# Patient Record
Sex: Male | Born: 1965 | Smoking: Current every day smoker
Health system: Southern US, Community
[De-identification: ages and names within clinical notes are randomized; demographics above are authoritative.]

---

## 2013-12-05 ENCOUNTER — Inpatient Hospital Stay (HOSPITAL_COMMUNITY)
Admission: EM | Admit: 2013-12-05 | Discharge: 2014-01-03 | DRG: 917 | Disposition: E | Payer: Non-veteran care | Attending: Pulmonary Disease | Admitting: Pulmonary Disease

## 2013-12-05 ENCOUNTER — Inpatient Hospital Stay (HOSPITAL_COMMUNITY): Payer: Non-veteran care

## 2013-12-05 ENCOUNTER — Encounter (HOSPITAL_COMMUNITY): Payer: Self-pay | Admitting: Emergency Medicine

## 2013-12-05 ENCOUNTER — Emergency Department (HOSPITAL_COMMUNITY): Payer: Non-veteran care

## 2013-12-05 DIAGNOSIS — E870 Hyperosmolality and hypernatremia: Secondary | ICD-10-CM | POA: Diagnosis present

## 2013-12-05 DIAGNOSIS — Z515 Encounter for palliative care: Secondary | ICD-10-CM

## 2013-12-05 DIAGNOSIS — R4020 Unspecified coma: Secondary | ICD-10-CM | POA: Diagnosis present

## 2013-12-05 DIAGNOSIS — R7309 Other abnormal glucose: Secondary | ICD-10-CM | POA: Diagnosis present

## 2013-12-05 DIAGNOSIS — E872 Acidosis, unspecified: Secondary | ICD-10-CM

## 2013-12-05 DIAGNOSIS — D696 Thrombocytopenia, unspecified: Secondary | ICD-10-CM | POA: Diagnosis present

## 2013-12-05 DIAGNOSIS — Z66 Do not resuscitate: Secondary | ICD-10-CM | POA: Diagnosis not present

## 2013-12-05 DIAGNOSIS — G935 Compression of brain: Secondary | ICD-10-CM | POA: Diagnosis present

## 2013-12-05 DIAGNOSIS — E874 Mixed disorder of acid-base balance: Secondary | ICD-10-CM | POA: Diagnosis present

## 2013-12-05 DIAGNOSIS — I498 Other specified cardiac arrhythmias: Secondary | ICD-10-CM | POA: Diagnosis present

## 2013-12-05 DIAGNOSIS — F1420 Cocaine dependence, uncomplicated: Secondary | ICD-10-CM | POA: Diagnosis present

## 2013-12-05 DIAGNOSIS — F10239 Alcohol dependence with withdrawal, unspecified: Secondary | ICD-10-CM | POA: Diagnosis present

## 2013-12-05 DIAGNOSIS — R6520 Severe sepsis without septic shock: Secondary | ICD-10-CM

## 2013-12-05 DIAGNOSIS — T68XXXA Hypothermia, initial encounter: Secondary | ICD-10-CM | POA: Diagnosis present

## 2013-12-05 DIAGNOSIS — T50901A Poisoning by unspecified drugs, medicaments and biological substances, accidental (unintentional), initial encounter: Principal | ICD-10-CM | POA: Diagnosis present

## 2013-12-05 DIAGNOSIS — J9600 Acute respiratory failure, unspecified whether with hypoxia or hypercapnia: Secondary | ICD-10-CM | POA: Diagnosis present

## 2013-12-05 DIAGNOSIS — T50904A Poisoning by unspecified drugs, medicaments and biological substances, undetermined, initial encounter: Secondary | ICD-10-CM | POA: Diagnosis present

## 2013-12-05 DIAGNOSIS — I469 Cardiac arrest, cause unspecified: Secondary | ICD-10-CM

## 2013-12-05 DIAGNOSIS — J189 Pneumonia, unspecified organism: Secondary | ICD-10-CM | POA: Diagnosis present

## 2013-12-05 DIAGNOSIS — F10229 Alcohol dependence with intoxication, unspecified: Secondary | ICD-10-CM | POA: Diagnosis present

## 2013-12-05 DIAGNOSIS — R739 Hyperglycemia, unspecified: Secondary | ICD-10-CM

## 2013-12-05 DIAGNOSIS — G931 Anoxic brain damage, not elsewhere classified: Secondary | ICD-10-CM | POA: Diagnosis present

## 2013-12-05 DIAGNOSIS — J690 Pneumonitis due to inhalation of food and vomit: Secondary | ICD-10-CM | POA: Diagnosis present

## 2013-12-05 DIAGNOSIS — N179 Acute kidney failure, unspecified: Secondary | ICD-10-CM | POA: Diagnosis present

## 2013-12-05 DIAGNOSIS — F1910 Other psychoactive substance abuse, uncomplicated: Secondary | ICD-10-CM | POA: Diagnosis present

## 2013-12-05 DIAGNOSIS — G936 Cerebral edema: Secondary | ICD-10-CM | POA: Diagnosis present

## 2013-12-05 DIAGNOSIS — R0902 Hypoxemia: Secondary | ICD-10-CM

## 2013-12-05 DIAGNOSIS — A419 Sepsis, unspecified organism: Secondary | ICD-10-CM | POA: Diagnosis not present

## 2013-12-05 DIAGNOSIS — F10939 Alcohol use, unspecified with withdrawal, unspecified: Secondary | ICD-10-CM | POA: Diagnosis present

## 2013-12-05 DIAGNOSIS — E232 Diabetes insipidus: Secondary | ICD-10-CM | POA: Diagnosis present

## 2013-12-05 DIAGNOSIS — E876 Hypokalemia: Secondary | ICD-10-CM | POA: Diagnosis present

## 2013-12-05 DIAGNOSIS — F17200 Nicotine dependence, unspecified, uncomplicated: Secondary | ICD-10-CM | POA: Diagnosis present

## 2013-12-05 LAB — COMPREHENSIVE METABOLIC PANEL
ALT: 496 U/L — ABNORMAL HIGH (ref 0–53)
AST: 333 U/L — ABNORMAL HIGH (ref 0–37)
Albumin: 2.7 g/dL — ABNORMAL LOW (ref 3.5–5.2)
Alkaline Phosphatase: 43 U/L (ref 39–117)
BUN: 14 mg/dL (ref 6–23)
CO2: 8 mEq/L — CL (ref 19–32)
CREATININE: 1.57 mg/dL — AB (ref 0.50–1.35)
Calcium: 8.1 mg/dL — ABNORMAL LOW (ref 8.4–10.5)
Chloride: 101 mEq/L (ref 96–112)
GFR calc Af Amer: 59 mL/min — ABNORMAL LOW (ref >90)
GFR, EST NON AFRICAN AMERICAN: 51 mL/min — AB (ref >90)
Glucose, Bld: 486 mg/dL — ABNORMAL HIGH (ref 70–99)
Potassium: 4.6 mEq/L (ref 3.7–5.3)
Sodium: 146 mEq/L (ref 137–147)
Total Bilirubin: 0.2 mg/dL — ABNORMAL LOW (ref 0.3–1.2)
Total Protein: 5.4 g/dL — ABNORMAL LOW (ref 6.0–8.3)

## 2013-12-05 LAB — POCT I-STAT 3, ART BLOOD GAS (G3+)
ACID-BASE DEFICIT: 28 mmol/L — AB (ref 0.0–2.0)
Acid-base deficit: 17 mmol/L — ABNORMAL HIGH (ref 0.0–2.0)
BICARBONATE: 9.3 meq/L — AB (ref 20.0–24.0)
Bicarbonate: 11.6 mEq/L — ABNORMAL LOW (ref 20.0–24.0)
O2 SAT: 99 %
O2 Saturation: 100 %
PCO2 ART: 27.6 mmHg — AB (ref 35.0–45.0)
PH ART: 7.198 — AB (ref 7.350–7.450)
PO2 ART: 124 mmHg — AB (ref 80.0–100.0)
Patient temperature: 31.7
TCO2: 12 mmol/L (ref 0–100)
TCO2: 13 mmol/L (ref 0–100)
pCO2 arterial: 57.5 mmHg (ref 35.0–45.0)
pH, Arterial: 6.752 — CL (ref 7.350–7.450)
pO2, Arterial: 321 mmHg — ABNORMAL HIGH (ref 80.0–100.0)

## 2013-12-05 LAB — BASIC METABOLIC PANEL
BUN: 13 mg/dL (ref 6–23)
CALCIUM: 6.9 mg/dL — AB (ref 8.4–10.5)
CO2: 9 meq/L — AB (ref 19–32)
CREATININE: 1.18 mg/dL (ref 0.50–1.35)
Chloride: 99 mEq/L (ref 96–112)
GFR calc Af Amer: 83 mL/min — ABNORMAL LOW (ref >90)
GFR, EST NON AFRICAN AMERICAN: 71 mL/min — AB (ref >90)
GLUCOSE: 321 mg/dL — AB (ref 70–99)
Potassium: 5.4 mEq/L — ABNORMAL HIGH (ref 3.7–5.3)
SODIUM: 144 meq/L (ref 137–147)

## 2013-12-05 LAB — CBC WITH DIFFERENTIAL/PLATELET
BASOS PCT: 0 % (ref 0–1)
Basophils Absolute: 0 10*3/uL (ref 0.0–0.1)
Eosinophils Absolute: 0 10*3/uL (ref 0.0–0.7)
Eosinophils Relative: 0 % (ref 0–5)
HEMATOCRIT: 41.6 % (ref 39.0–52.0)
HEMOGLOBIN: 13.4 g/dL (ref 13.0–17.0)
LYMPHS ABS: 3.4 10*3/uL (ref 0.7–4.0)
Lymphocytes Relative: 43 % (ref 12–46)
MCH: 31 pg (ref 26.0–34.0)
MCHC: 32.2 g/dL (ref 30.0–36.0)
MCV: 96.3 fL (ref 78.0–100.0)
MONOS PCT: 5 % (ref 3–12)
Monocytes Absolute: 0.4 10*3/uL (ref 0.1–1.0)
NEUTROS ABS: 4.2 10*3/uL (ref 1.7–7.7)
Neutrophils Relative %: 52 % (ref 43–77)
Platelets: 89 10*3/uL — ABNORMAL LOW (ref 150–400)
RBC: 4.32 MIL/uL (ref 4.22–5.81)
RDW: 13.8 % (ref 11.5–15.5)
WBC: 8 10*3/uL (ref 4.0–10.5)

## 2013-12-05 LAB — POCT I-STAT, CHEM 8
BUN: 16 mg/dL (ref 6–23)
BUN: 13 mg/dL (ref 6–23)
CALCIUM ION: 1.07 mmol/L — AB (ref 1.12–1.23)
CREATININE: 1.2 mg/dL (ref 0.50–1.35)
Calcium, Ion: 0.92 mmol/L — ABNORMAL LOW (ref 1.12–1.23)
Chloride: 108 mEq/L (ref 96–112)
Chloride: 108 mEq/L (ref 96–112)
Creatinine, Ser: 1.6 mg/dL — ABNORMAL HIGH (ref 0.50–1.35)
Glucose, Bld: 470 mg/dL — ABNORMAL HIGH (ref 70–99)
Glucose, Bld: 325 mg/dL — ABNORMAL HIGH (ref 70–99)
HCT: 41 % (ref 39.0–52.0)
HCT: 52 % (ref 39.0–52.0)
HEMOGLOBIN: 17.7 g/dL — AB (ref 13.0–17.0)
Hemoglobin: 13.9 g/dL (ref 13.0–17.0)
POTASSIUM: 5.3 meq/L (ref 3.7–5.3)
Potassium: 4.2 mEq/L (ref 3.7–5.3)
SODIUM: 145 meq/L (ref 137–147)
Sodium: 140 mEq/L (ref 137–147)
TCO2: 13 mmol/L (ref 0–100)
TCO2: 12 mmol/L (ref 0–100)

## 2013-12-05 LAB — URINALYSIS, ROUTINE W REFLEX MICROSCOPIC
Bilirubin Urine: NEGATIVE
Glucose, UA: NEGATIVE mg/dL
HGB URINE DIPSTICK: NEGATIVE
KETONES UR: NEGATIVE mg/dL
Leukocytes, UA: NEGATIVE
Nitrite: NEGATIVE
PROTEIN: NEGATIVE mg/dL
Specific Gravity, Urine: 1.019 (ref 1.005–1.030)
Urobilinogen, UA: 0.2 mg/dL (ref 0.0–1.0)
pH: 5 (ref 5.0–8.0)

## 2013-12-05 LAB — APTT: aPTT: 34 seconds (ref 24–37)

## 2013-12-05 LAB — POCT I-STAT TROPONIN I: Troponin i, poc: 0.05 ng/mL (ref 0.00–0.08)

## 2013-12-05 LAB — RAPID URINE DRUG SCREEN, HOSP PERFORMED
AMPHETAMINES: NOT DETECTED
BARBITURATES: NOT DETECTED
Benzodiazepines: NOT DETECTED
Cocaine: POSITIVE — AB
Opiates: POSITIVE — AB
Tetrahydrocannabinol: NOT DETECTED

## 2013-12-05 LAB — CG4 I-STAT (LACTIC ACID): Lactic Acid, Venous: 16.28 mmol/L — ABNORMAL HIGH (ref 0.5–2.2)

## 2013-12-05 LAB — PROTIME-INR
INR: 1.23 (ref 0.00–1.49)
Prothrombin Time: 15.2 seconds (ref 11.6–15.2)

## 2013-12-05 LAB — ETHANOL: ALCOHOL ETHYL (B): 66 mg/dL — AB (ref 0–11)

## 2013-12-05 MED ORDER — ATROPINE SULFATE 1 MG/ML IJ SOLN
INTRAMUSCULAR | Status: AC | PRN
  Administered 2013-12-05: 1 mg via INTRAVENOUS

## 2013-12-05 MED ORDER — SODIUM BICARBONATE 8.4 % IV SOLN
INTRAVENOUS | Status: AC
Start: 2013-12-05 — End: 2013-12-06
  Filled 2013-12-05: qty 150

## 2013-12-05 MED ORDER — FAMOTIDINE IN NACL 20-0.9 MG/50ML-% IV SOLN
20.0000 mg | Freq: Two times a day (BID) | INTRAVENOUS | Status: DC
  Administered 2013-12-05 – 2013-12-07 (×4): 20 mg via INTRAVENOUS
  Filled 2013-12-05 (×5): qty 50

## 2013-12-05 MED ORDER — SODIUM CHLORIDE 0.9 % IV SOLN
INTRAVENOUS | Status: DC
  Administered 2013-12-05: 2.4 [IU]/h via INTRAVENOUS
  Filled 2013-12-05: qty 1

## 2013-12-05 MED ORDER — DEXTROSE 5 % IV SOLN
1.0000 g | Freq: Three times a day (TID) | INTRAVENOUS | Status: DC
  Administered 2013-12-05 – 2013-12-08 (×8): 1 g via INTRAVENOUS
  Filled 2013-12-05 (×10): qty 1

## 2013-12-05 MED ORDER — SODIUM CHLORIDE 0.9 % IV SOLN
1.0000 ug/kg/min | INTRAVENOUS | Status: DC
  Administered 2013-12-05: 1 ug/kg/min via INTRAVENOUS
  Filled 2013-12-05: qty 20

## 2013-12-05 MED ORDER — SODIUM CHLORIDE 0.9 % IV SOLN: 2000.0000 mL | Freq: Once | INTRAVENOUS | Status: AC

## 2013-12-05 MED ORDER — ARTIFICIAL TEARS OP OINT
1.0000 "application " | TOPICAL_OINTMENT | Freq: Three times a day (TID) | OPHTHALMIC | Status: DC
  Administered 2013-12-05 – 2013-12-06 (×3): 1 via OPHTHALMIC
  Filled 2013-12-05: qty 3.5

## 2013-12-05 MED ORDER — CISATRACURIUM BOLUS VIA INFUSION
0.1000 mg/kg | Freq: Once | INTRAVENOUS | Status: AC
  Filled 2013-12-05: qty 9

## 2013-12-05 MED ORDER — PROPOFOL 10 MG/ML IV EMUL
5.0000 ug/kg/min | INTRAVENOUS | Status: DC
  Administered 2013-12-05: 5 ug/kg/min via INTRAVENOUS
  Administered 2013-12-06: 30 ug/kg/min via INTRAVENOUS
  Administered 2013-12-06: 30.025 ug/kg/min via INTRAVENOUS
  Filled 2013-12-05 (×3): qty 100

## 2013-12-05 MED ORDER — VASOPRESSIN 20 UNIT/ML IJ SOLN
0.0300 [IU]/min | INTRAMUSCULAR | Status: DC
  Administered 2013-12-05: 0.03 [IU]/min via INTRAVENOUS
  Filled 2013-12-05: qty 2.5

## 2013-12-05 MED ORDER — SODIUM BICARBONATE 8.4 % IV SOLN
50.0000 meq | Freq: Once | INTRAVENOUS | Status: AC
  Administered 2013-12-05: 50 meq via INTRAVENOUS
  Filled 2013-12-05: qty 50

## 2013-12-05 MED ORDER — SODIUM CHLORIDE 0.9 % IV SOLN: 250.0000 mL | INTRAVENOUS | Status: DC | PRN

## 2013-12-05 MED ORDER — NOREPINEPHRINE BITARTRATE 1 MG/ML IJ SOLN
2.0000 ug/min | INTRAVENOUS | Status: DC
  Administered 2013-12-05: 25 ug/min via INTRAVENOUS
  Administered 2013-12-06: 42 ug/min via INTRAVENOUS
  Filled 2013-12-05 (×2): qty 4

## 2013-12-05 MED ORDER — HEPARIN SODIUM (PORCINE) 5000 UNIT/ML IJ SOLN
5000.0000 [IU] | Freq: Three times a day (TID) | INTRAMUSCULAR | Status: DC
  Administered 2013-12-05 – 2013-12-08 (×8): 5000 [IU] via SUBCUTANEOUS
  Filled 2013-12-05 (×11): qty 1

## 2013-12-05 MED ORDER — STERILE WATER FOR INJECTION IV SOLN
INTRAVENOUS | Status: DC
  Administered 2013-12-05 – 2013-12-06 (×3): via INTRAVENOUS
  Filled 2013-12-05 (×7): qty 850

## 2013-12-05 MED ORDER — SODIUM BICARBONATE 8.4 % IV SOLN
INTRAVENOUS | Status: AC | PRN
  Administered 2013-12-05 (×3): 50 meq via INTRAVENOUS

## 2013-12-05 MED ORDER — VANCOMYCIN HCL IN DEXTROSE 1-5 GM/200ML-% IV SOLN
1000.0000 mg | Freq: Once | INTRAVENOUS | Status: AC
  Administered 2013-12-05: 1000 mg via INTRAVENOUS
  Filled 2013-12-05: qty 200

## 2013-12-05 MED ORDER — DOPAMINE-DEXTROSE 3.2-5 MG/ML-% IV SOLN: 2.0000 ug/kg/min | INTRAVENOUS | Status: DC

## 2013-12-05 MED ORDER — NOREPINEPHRINE BITARTRATE 1 MG/ML IJ SOLN
INTRAMUSCULAR | Status: AC | PRN
  Administered 2013-12-05: 20 ug/kg/min via INTRAVENOUS

## 2013-12-05 MED ORDER — VANCOMYCIN HCL IN DEXTROSE 750-5 MG/150ML-% IV SOLN
750.0000 mg | Freq: Two times a day (BID) | INTRAVENOUS | Status: DC
  Administered 2013-12-06 – 2013-12-07 (×3): 750 mg via INTRAVENOUS
  Filled 2013-12-05 (×4): qty 150

## 2013-12-05 MED ORDER — NOREPINEPHRINE BITARTRATE 1 MG/ML IJ SOLN
2.0000 ug/min | Freq: Once | INTRAVENOUS | Status: DC
  Filled 2013-12-05: qty 4

## 2013-12-05 MED ORDER — FENTANYL BOLUS VIA INFUSION
50.0000 ug | INTRAVENOUS | Status: DC | PRN
  Filled 2013-12-05: qty 50

## 2013-12-05 MED ORDER — SODIUM BICARBONATE 8.4 % IV SOLN
INTRAVENOUS | Status: AC
  Filled 2013-12-05: qty 50

## 2013-12-05 MED ORDER — SODIUM CHLORIDE 0.9 % IV BOLUS (SEPSIS)
1000.0000 mL | Freq: Once | INTRAVENOUS | Status: AC
  Administered 2013-12-05: 1000 mL via INTRAVENOUS

## 2013-12-05 NOTE — ED Notes (Signed)
Per [facility]: Pt found at home in car by family. Pt was pulseless and not breathing. Fire started CPR on scene, EMS intubated patient. Pt received 4 EPIs in route, 2mg  of Narcan, 1 amp of D50. Pt was initially in asystole. Pt has known history of drug abuse (cocaine) and alcohol abuse. Pt rosc at 1900.

## 2013-12-05 NOTE — Progress Notes (Signed)
eLink Physician-Brief Progress Note Patient Name: John McburneyGlenn A Walker DOB: 1966/02/25 MRN: [id number]  Date of Service  September 29, 2014   HPI/Events of Note   Hyperglycemia noted  eICU Interventions   Insulin drip ordered   Intervention Category Major Interventions: Hyperglycemia - active titration of insulin therapy  Maley Venezia R. September 29, 2014, 10:04 PM

## 2013-12-05 NOTE — ED Notes (Signed)
Patient in CT at this time. RN and RT with patient.

## 2013-12-05 NOTE — ED Notes (Signed)
Correction to previous note. Pt's father found patient at patient's house. Pt was sitting in the car, with his keys in his hands as if he was getting ready to get out of the car. Pt was slumped over.

## 2013-12-05 NOTE — Consult Note (Signed)
ANTIBIOTIC CONSULT NOTE - INITIAL  Pharmacy Consult for Vancomycin and Cefepime Indication: sepsis  Not on File  Patient Measurements: Height: 5\' 10"  (177.8 cm) Weight: 180 lb (81.647 kg) IBW/kg (Calculated) : 73  Vital Signs: Temp: 90 F (32.2 C) (01/31 1910) Temp src: Rectal (01/31 1910) BP: 64/45 mmHg (01/31 1930) Pulse Rate: 53 (01/31 1930) Intake/Output from previous day:   Intake/Output from this shift: Total I/O In: 1200 [I.V.:1200] Out: -   Labs:  Recent Labs  01-30-14 1924  HGB 13.9  CREATININE 1.60*   Estimated Creatinine Clearance: 58.3 ml/min (by C-G formula based on Cr of 1.6).  Microbiology: No results found for this or any previous visit (from the past 720 hour(s)).  Medical History: History reviewed. No pertinent past medical history.  Assessment: 47yom with history of drug and alcohol abuse was found in his car by family. He was pulseless and not breathing. Pulses regained after CPR and several rounds of epi. In the ED he is hypotensive and lactic acid is elevated. He will begin empiric antibiotics for probable sepsis. Creatinine is elevated at 1.6.  Goal of Therapy:  Vancomycin trough level 15-20 mcg/ml  Plan:  1) Vancomycin 1g IV x 1 then 750mg  IV q12 2) Cefepime 1g IV q8 3) Follow renal function, cultures, LOT, level if needed  Fredrik RiggerMarkle, Tyla Burgner Sue 07/13/2014,7:41 PM

## 2013-12-05 NOTE — Procedures (Signed)
Arterial Catheter Insertion Procedure Note John McburneyGlenn A Walker [id number] 06/05/1966  Procedure: Insertion of Arterial Catheter  Indications: Blood pressure monitoring, frequent blood sampling  Procedure Details Consent: Unable to obtain consent because of emergent medical necessity. Pt. Intubated on ventilator.  Time Out: Verified patient identification, verified procedure, site/side was marked, verified correct patient position, special equipment/implants available, medications/allergies/relevent history reviewed, required imaging and test results available.  Performed  Maximum sterile technique was used including cap, gloves, gown, hand hygiene, mask and sheet. Skin prep: Chlorhexidine; local anesthetic administered 20 gauge catheter was inserted into right radial artery using the Seldinger technique.  Evaluation Blood flow good; BP tracing good. Complications: No apparent complications.  Assisted by Gordy SaversFrancis Almor, RRT.    Carlynn SpryCobb, Analeise Mccleery L 11/12/2013

## 2013-12-05 NOTE — H&P (Signed)
PULMONARY  / CRITICAL CARE MEDICINE HISTORY AND PHYSICAL EXAMINATION  Name: John Walker MRN: [id number] DOB: April 24, 1966    ADMISSION DATE:  12/04/2013  CHIEF COMPLAINT:  Cardiac arrest  BRIEF PATIENT DESCRIPTION:  48 yo male with h/o cocaine and alcohol abuse found in car, in cardiac arrest upon EMS arrival requiring ACLS x 2 with ROSC.  SIGNIFICANT EVENTS / STUDIES:  1. Cardiac arrest 11/13/2013 2. Endotracheal intubation 11/23/2013 3. CT Head 11/12/2013 - global loss of normal gray-white differentiation most concerning for anoxic injury  LINES / TUBES: 1. OETT 11/30/2013 >>> 2. R subclavian 11/10/2013>>> 3. Foley catheter 11/20/2013>>>  CULTURES: 1. Blood cultures x 2 12/04/2013>>> 2. Tracheal aspirate culture 11/13/2013>>>  ANTIBIOTICS: 1. Vancomycin 11/29/2013>>> 2. Cefepime 11/16/2013>>>  HISTORY OF PRESENT ILLNESS:   48 yo male with h/o substance abuse (cocaine and alcohol) who presented with cardiac arrest. Per mother and father, he was found unresponsive in his car today, with unknown down time. On EMS arrival, patient found to be in cardiac arrest and required ACLS x 2 with 4 rounds of epi and with ROSC as well as intubation. On arrival to ED, patient found to be severely hypothermic, bradycardic, hypotensive and with severe acidosis. He was started on pressors and bicarb gtt and R subclavian line was placed in ED. Family reports that the last contact they had with him was the night prior and he had had no complaints and seemed to be in his usual state of health.  PAST MEDICAL HISTORY :  Unable to obtain due to patient clinical status History reviewed. No pertinent past medical history.  History reviewed. No pertinent past surgical history.  Prior to Admission medications   Not on File    No Known Allergies  FAMILY HISTORY: unable to obtain due to patient's clincal status No family history on file.  SOCIAL HISTORY: per medical record, family also reports history of  cocaine and alcohol use  reports that he has been smoking.  He does not have any smokeless tobacco history on file. He reports that he drinks alcohol. He reports that he uses illicit drugs (Cocaine).  REVIEW OF SYSTEMS:  Unable to obtain secondary to patient's clinical status  PHYSICAL EXAM  VITAL SIGNS: Temp:  [86.9 F (30.5 C)-91.4 F (33 C)] 91.4 F (33 C) (02/01 0200) Pulse Rate:  [38-84] 72 (02/01 0200) Resp:  [12-30] 26 (02/01 0200) BP: (54-223)/(36-135) 150/98 mmHg (02/01 0200) SpO2:  [96 %-100 %] 99 % (02/01 0200) FiO2 (%):  [50 %-100 %] 50 % (02/01 0200) Weight:  [180 lb (81.647 kg)-188 lb 7.9 oz (85.5 kg)] 188 lb 7.9 oz (85.5 kg) (01/31 2200)  HEMODYNAMICS: CVP:  [12 mmHg] 12 mmHg  VENTILATOR SETTINGS: Vent Mode:  [-] PRVC FiO2 (%):  [50 %-100 %] 50 % Set Rate:  [16 bmp-26 bmp] 26 bmp Vt Set:  [520 mL] 520 mL PEEP:  [5 cmH20] 5 cmH20 Plateau Pressure:  [14 cmH20-20 cmH20] 15 cmH20  INTAKE / OUTPUT: Intake/Output     01/31 0701 - 02/01 0700   I.V. (mL/kg) 4515.1 (52.8)   IV Piggyback 160   Total Intake(mL/kg) 4675.1 (54.7)   Urine (mL/kg/hr) 3000   Total Output 3000   Net +1675.1         PHYSICAL EXAMINATION: General:  Appearing older than stated age, no acute distress, intubated  Neuro:  Unresponsive HEENT:  AT, [place], pupils fixed and dilated, mmm, ETT in place Neck:  Supple Cardiovascular:  RRR, no murmurs/rubs/gallops Lungs:  Coarse breath sounds bilaterally Abdomen:  +BS, soft, NT, ND Musculoskeletal:  No clubbing or cyanosis Skin:  No rash  LABS:  CBC Recent Labs     11/12/2013  1909  11/21/2013  1924  12/04/2013  2220  12/06/13  0025  WBC  8.0   --    --    --   HGB  13.4  13.9  17.7*  16.7  HCT  41.6  41.0  52.0  49.0  PLT  89*   --    --    --     Coag's Recent Labs     11/20/2013  2215  APTT  34  INR  1.23    BMET Recent Labs     11/15/2013  1909   11/25/2013  2215  11/05/2013  2220  12/06/13  0001  12/06/13  0025  NA  146   < >   144  145  141  145  K  4.6   < >  5.4*  5.3  6.1*  5.0  CL  101   < >  99  108  100  108  CO2  8*   --   9*   --   10*   --   BUN  14   < >  13  13  15  16   CREATININE  1.57*   < >  1.18  1.20  1.11  1.10  GLUCOSE  486*   < >  321*  325*  264*  246*   < > = values in this interval not displayed.    Electrolytes Recent Labs     11/12/2013  1909  11/24/2013  2215  12/06/13  0001  CALCIUM  8.1*  6.9*  7.0*    Sepsis Markers No results found for this basename: LACTICACIDVEN, PROCALCITON, O2SATVEN,  in the last 72 hours  ABG Recent Labs     11/09/2013  1926  11/06/2013  2312  PHART  6.752*  7.198*  PCO2ART  57.5*  27.6*  PO2ART  321.0*  124.0*    Liver Enzymes Recent Labs     11/12/2013  1909  AST  333*  ALT  496*  ALKPHOS  43  BILITOT  0.2*  ALBUMIN  2.7*    Cardiac Enzymes Recent Labs     12/06/13  0001  TROPONINI  0.40*    Glucose Recent Labs     11/17/2013  2158  11/12/2013  2313  12/06/13  0005  GLUCAP  292*  281*  239*    Imaging Ct Head Wo Contrast  11/23/2013   CLINICAL DATA:  Status post cardiac arrest  EXAM: CT HEAD WITHOUT CONTRAST  TECHNIQUE: Contiguous axial images were obtained from the base of the skull through the vertex without intravenous contrast.  COMPARISON:  None.  FINDINGS: There is no evidence of mass effect, midline shift or extra-axial fluid collections. There is no evidence of a space-occupying lesion or intracranial hemorrhage. There is global loss of the normal gray-white differentiation.  The ventricles and sulci are appropriate for the patient's age. The basal cisterns are patent.  Visualized portions of the orbits are unremarkable. There is bilateral ethmoid sinus mucosal thickening. There is a left maxillary sinus mucous retention cyst.  The osseous structures are unremarkable.  IMPRESSION: Global loss of normal gray-white differentiation most concerning for anoxic injury.   Electronically Signed   By: Elige Ko   On: 11/26/2013 20:54    Dg  Chest Port 1 View  12/04/2013   CLINICAL DATA:  Line placement and endotracheal tube  EXAM: PORTABLE CHEST - 1 VIEW  COMPARISON:  None.  FINDINGS: Heart size upper normal to mildly enlarged likely exaggerated by limited inspiratory effect. Endotracheal tube extends into the right main bronchus. Right subclavian central line crosses midline into the left brachiocephalic vein. No pneumothorax. No consolidation.  IMPRESSION: Central line crosses midline as described above into the left brachiocephalic vein.  Endotracheal tube should be retracted approximately 4 cm, with subsequent repetition of chest radiograph 2 verify appropriate positioning.  Critical Value/emergent results were called by telephone at the time of interpretation on 11/09/2013 at 8:17 PM to Dr. Melene Plan, who verbally acknowledged these results.   Electronically Signed   By: Esperanza Heir M.D.   On: 11/26/2013 20:17    EKG: NSR 53 bpm, J wave present, QTc 562   ASSESSMENT / PLAN: 48 yo male with substance abuse history found in car and presenting with cardiac arrest. Patient with urine drug screen positive for cocaine, opiates and ethanol. He was noted to be hypothermic, hypotensive and with severe acidosis requiring bicarb gtt. CT Head also concerning for anoxic brain injury.  Active Problems:   Cardiac arrest   PULMONARY A/P: 1. Respiratory failure related to cardiac arrest: now on mechanical ventilation  Continue mechanical ventilation, wean O2 for SpO2 >/= 92%  Monitor ABGs  VAP prevention bundle  SBT as able  Minimize sedation in the setting of encephalopathy and concern for anoxic brain injury  CARDIOVASCULAR A/P:  1.   Cardiac arrest: instigating event unclear, could be related to ingestion, hypothermia, possible aspiration  Initiated post cardiac arrest hypothermia protocol  Consider cardiology consult 2.   Hypotension: likely related to cardiac arrest  Currently on norepinephrine and vasopressin,  escalate as needed  Echocardiogram pending  Trend cardiac markers 3.   Bradycardia: suspect related to hypothermia and now improving, continue to monitor  RENAL A/P:  1. Severe acidosis: related to cardiac arrest with elevated lactic acidosis  Currently on bicarb gtt and requiring pushes of bicarb as well  Continue to monitor serum lactate and ABG 2. Acute kidney injury, likely related to cardiac arrest  Continue to monitor closely  Strict I/O  Avoid nephrotoxic agents and renally dose medications   GASTROINTESTINAL A/P:  1. Elevated liver enzymes: likely related to cardiac arrest  Continue to monitor  HEMATOLOGIC A/P:   1. DVT prophylaxis with heparin given elevated Cr  INFECTIOUS A/P: 1. Possible sepsis given hypotension and with unknown events preceding cardiac arrest  Trach aspirate cultures, blood cultures  UA neg  Started on vancomycin and cefepime but can probably discontinue these rapidly if cultures unrevealing, no other signs of infection at this time  ENDOCRINE A/P: 1. Hyperglycemia  Insulin gtt  NEUROLOGIC A/P: 1. Encephalopathy: concern for anoxic brain injury given CT findings  Hold sedating medications  Continue to monitor closely 2.   History of alcohol abuse  Monitor for signs of alcohol withdrawal  Started on multivitamin and thiamine  BEST PRACTICE / DISPOSITION Level of Care:  ICU Consultants:  None Code Status:  Full Diet:  NPO for now, will likely require initiation of tube feeds DVT Px:  heparin GI Px:  pantoprozole Skin Integrity:  intact Social / Family:  Mother and father updated at bedside    I have personally obtained a history, examined the patient, evaluated laboratory and imaging results, formulated the assessment and plan and placed orders.  CRITICAL  CARE: The patient is critically ill with multiple organ systems failure and requires high complexity decision making for assessment and support, frequent  evaluation and titration of therapies, application of advanced monitoring technologies and extensive interpretation of multiple databases. Critical Care Time devoted to patient care services described in this note is 60 minutes.   Germain Osgoodornelia Javon Hupfer, MD Pulmonary and Critical Care Medicine [facility] Pager: [phone]  12/06/2013, 3:27 AM

## 2013-12-05 NOTE — ED Provider Notes (Signed)
CSN: [id number]     Arrival date & time 12/02/2013  1909 History   First MD Initiated Contact with Patient 11/25/2013 1921     Chief Complaint  Patient presents with  . Cardiac Arrest   (Consider location/radiation/quality/duration/timing/severity/associated sxs/prior Treatment) Patient is a 48 y.o. male presenting with altered mental status. The history is provided by the EMS personnel. The history is limited by the condition of the patient.  Altered Mental Status Presenting symptoms: unresponsiveness   Severity:  Severe Most recent episode:  Today Episode history:  Single Duration:  2 hours Timing:  Constant Progression:  Unchanged Chronicity:  New Context: not alcohol use, not head injury and not homeless     History reviewed. No pertinent past medical history. History reviewed. No pertinent past surgical history. No family history on file. History  Substance Use Topics  . Smoking status: Not on file  . Smokeless tobacco: Not on file  . Alcohol Use: Not on file    Review of Systems  Unable to perform ROS: Patient unresponsive    Allergies  Review of patient's allergies indicates not on file.  Home Medications  No current outpatient prescriptions on file. BP 115/70  Pulse 70  Temp(Src) 90 F (32.2 C) (Rectal)  Resp 26  Ht 5\' 10"  (1.778 m)  Wt 188 lb 7.9 oz (85.5 kg)  BMI 27.05 kg/m2  SpO2 99% Physical Exam  Nursing note and vitals reviewed. Constitutional: He appears well-developed and well-nourished.  Cold to touch  HENT:  Head: Normocephalic and atraumatic.  Eyes: EOM are normal.  Fixed and dilated  Neck: Normal range of motion. Neck supple. No JVD present.  Cardiovascular: Normal rate and regular rhythm.  Exam reveals no gallop and no friction rub.   No murmur heard. Pulmonary/Chest: No respiratory distress. He has no wheezes.  Mild abrasions to the right chest wall there admitted to the lower back. No step-offs or deformities to the CT or L-spine. No  other noted signs of trauma.  Abdominal: He exhibits no distension. There is no rebound and no guarding.  Musculoskeletal: Normal range of motion.  Neurological:  Unresponsive  Skin: No rash noted. No pallor.    ED Course  CENTRAL LINE Date/Time: 11/15/2013 7:58 PM Performed by: Melene PlanFLOYD, Sharene Krikorian Authorized by: Melene PlanFLOYD, Quamesha Mullet Consent: The procedure was performed in an emergent situation. Required items: required blood products, implants, devices, and special equipment available Patient identity confirmed: arm band and hospital-assigned identification number Time out: Immediately prior to procedure a "time out" was called to verify the correct patient, procedure, equipment, support staff and site/side marked as required. Indications: vascular access and central pressure monitoring Patient sedated: no Preparation: skin prepped with 2% chlorhexidine Skin prep agent dried: skin prep agent completely dried prior to procedure Sterile barriers: all five maximum sterile barriers used - cap, mask, sterile gown, sterile gloves, and large sterile sheet Hand hygiene: hand hygiene performed prior to central venous catheter insertion Location details: right subclavian Patient position: Trendelenburg Catheter type: triple lumen Pre-procedure: landmarks identified Ultrasound guidance: no Number of attempts: 1 Successful placement: yes Post-procedure: line sutured and dressing applied Assessment: blood return through all ports, free fluid flow, placement verified by x-ray and no pneumothorax on x-ray Patient tolerance: Patient tolerated the procedure well with no immediate complications. Comments: Line crossed over into left subclavian   (including critical care time) Labs Review Labs Reviewed  CBC WITH DIFFERENTIAL - Abnormal; Notable for the following:    Platelets 89 (*)    All  other components within normal limits  COMPREHENSIVE METABOLIC PANEL - Abnormal; Notable for the following:    CO2 8 (*)     Glucose, Bld 486 (*)    Creatinine, Ser 1.57 (*)    Calcium 8.1 (*)    Total Protein 5.4 (*)    Albumin 2.7 (*)    AST 333 (*)    ALT 496 (*)    Total Bilirubin 0.2 (*)    GFR calc non Af Amer 51 (*)    GFR calc Af Amer 59 (*)    All other components within normal limits  ETHANOL - Abnormal; Notable for the following:    Alcohol, Ethyl (B) 66 (*)    All other components within normal limits  URINE RAPID DRUG SCREEN (HOSP PERFORMED) - Abnormal; Notable for the following:    Opiates POSITIVE (*)    Cocaine POSITIVE (*)    All other components within normal limits  POCT I-STAT, CHEM 8 - Abnormal; Notable for the following:    Creatinine, Ser 1.60 (*)    Glucose, Bld 470 (*)    Calcium, Ion 1.07 (*)    All other components within normal limits  CG4 I-STAT (LACTIC ACID) - Abnormal; Notable for the following:    Lactic Acid, Venous 16.28 (*)    All other components within normal limits  POCT I-STAT 3, BLOOD GAS (G3+) - Abnormal; Notable for the following:    pH, Arterial 6.752 (*)    pCO2 arterial 57.5 (*)    pO2, Arterial 321.0 (*)    Bicarbonate 9.3 (*)    Acid-base deficit 28.0 (*)    All other components within normal limits  CULTURE, BLOOD (ROUTINE X 2)  CULTURE, BLOOD (ROUTINE X 2)  CULTURE, RESPIRATORY (NON-EXPECTORATED)  MRSA PCR SCREENING  URINALYSIS, ROUTINE W REFLEX MICROSCOPIC  BLOOD GAS, ARTERIAL  CBC  BASIC METABOLIC PANEL  BLOOD GAS, ARTERIAL  MAGNESIUM  PHOSPHORUS  CK TOTAL AND CKMB  CK TOTAL AND CKMB  TROPONIN I  TROPONIN I  BASIC METABOLIC PANEL  BASIC METABOLIC PANEL  BASIC METABOLIC PANEL  BASIC METABOLIC PANEL  BASIC METABOLIC PANEL  BASIC METABOLIC PANEL  BASIC METABOLIC PANEL  PROTIME-INR  PROTIME-INR  APTT  APTT  POCT I-STAT TROPONIN I   Imaging Review Ct Head Wo Contrast  12/18/2013   CLINICAL DATA:  Status post cardiac arrest  EXAM: CT HEAD WITHOUT CONTRAST  TECHNIQUE: Contiguous axial images were obtained from the base of the skull  through the vertex without intravenous contrast.  COMPARISON:  None.  FINDINGS: There is no evidence of mass effect, midline shift or extra-axial fluid collections. There is no evidence of a space-occupying lesion or intracranial hemorrhage. There is global loss of the normal gray-white differentiation.  The ventricles and sulci are appropriate for the patient's age. The basal cisterns are patent.  Visualized portions of the orbits are unremarkable. There is bilateral ethmoid sinus mucosal thickening. There is a left maxillary sinus mucous retention cyst.  The osseous structures are unremarkable.  IMPRESSION: Global loss of normal gray-white differentiation most concerning for anoxic injury.   Electronically Signed   By: Elige Ko   On: December 18, 2013 20:54   Dg Chest Port 1 View  Dec 18, 2013   CLINICAL DATA:  Line placement and endotracheal tube  EXAM: PORTABLE CHEST - 1 VIEW  COMPARISON:  None.  FINDINGS: Heart size upper normal to mildly enlarged likely exaggerated by limited inspiratory effect. Endotracheal tube extends into the right main bronchus. Right subclavian  central line crosses midline into the left brachiocephalic vein. No pneumothorax. No consolidation.  IMPRESSION: Central line crosses midline as described above into the left brachiocephalic vein.  Endotracheal tube should be retracted approximately 4 cm, with subsequent repetition of chest radiograph 2 verify appropriate positioning.  Critical Value/emergent results were called by telephone at the time of interpretation on 11/24/2013 at 8:17 PM to Dr. Melene Plan, who verbally acknowledged these results.   Electronically Signed   By: Esperanza Heir M.D.   On: 12/01/2013 20:17      MDM   1. Cardiac arrest   2. Lactic acidosis   3. Hypothermia   4. Hyperglycemia      48 year old male found in cardiac arrest in his car by his family. Family states it was unknown down time. EMS found the patient not breathing, with no pulse, CPR started on  the scene and intubated on the scene.4 rounds of epi 2 of Narcan amp of D50 patient initially was in asystole then had spontaneous return of return of pulses. Patient with a significant history of cocaine and alcohol abuse. Patient with intact pulses upon arrival initial EKG with [place] waves patient found to be severely hypothermic. The patient's initial labs with severe acidosis 6.7 patient started on a bicarbonate drip given 3 amps of bicarbonate I lactic acidosis of 16.28. Critical-care consult for admission.  Patient became hypotensive soon after presentation. Patient given one dose of atropine due to bradycardia into the 30s. Patient with some improvement with with pulse rates into the mid 50s. Patient continuing to be hypotensive place a minority drip currently at 20 mics patient with MAPS in the 13s.  Central line placed attempting place R. Line. ET tube right mainstem and on chest x-ray pulled back 4 cm.  Family denies any prior medical history other than alcohol and cocaine abuse.  Patient seen by critical care of a suppressant added to his pressure regimen. Patient with improvement of blood pressure taken to the ICU.    Melene Plan, MD 11/08/2013 2208

## 2013-12-05 NOTE — ED Provider Notes (Signed)
I saw and evaluated the patient, reviewed the resident's note and I agree with the findings and plan.   Pt is a 48 y.o. male with a history of alcohol and crack cocaine abuse who presents to the emergency department after he was found unresponsive in his car by his father today. Patient was last seen normal yesterday evening. They report the patient was unresponsive and not breathing. Upon EMS arrival, patient had no pulse and CPR was started. He had return of spontaneous circulation but then lost pulses again in route. Patient had another round of CPR. He received a total of 4 epinephrine injections, D50 and Narcan. Began having return of spontaneous circulation prior to arrival in the emergency department. They also intubated the patient in the field using a 7.5 endotracheal tube. In the emergency department, patient is nonresponsive with a GCS of 3. He has no spontaneous respirations. His pupils were fixed and dilated. He was bradycardic with a heart rate in the 40s but appears to be sinus. EKG shows no ischemic changes but there are John Walker waves present. He has no signs of trauma on exam but his extremities are cool. His rectal temperature was 86.1. Patient's initial troponin was negative. His labs showed a pH of 6.6, with significant metabolic acidosis. He has acute renal failure and liver damage. His head CT shows no intracranial abnormality but changes consistent with anoxic brain injury. Patient continued to be hypotensive and Levothroid was started. Patient was given 3 amps of bicarbonate and started on a bicarbonate drip for his metabolic acidosis. Discuss with critical care for admission. They recommended keeping the patient cooled to allow for the best neurologic outcome. Patient's urine drug screen is positive for opiates and cocaine. Alcohol level was slightly elevated. Updated patient's family.   EKG Interpretation    Date/Time:  Saturday December 05 2013 19:10:01 EST Ventricular Rate:   53 PR Interval:  168 QRS Duration: 152 QT Interval:  599 QTC Calculation: 562 R Axis:   83 Text Interpretation:  Sinus rhythm Left bundle branch block Osbourne waves Confirmed by John Farrelly  DO, John Walker ([id number]) on 11/15/2013 7:39:45 PM              CRITICAL CARE Performed by: John Walker, John Walker   Total critical care time: 60 minutes  Critical care time was exclusive of separately billable procedures and treating other patients.  Critical care was necessary to treat or prevent imminent or life-threatening deterioration.  Critical care was time spent personally by me on the following activities: development of treatment plan with patient and/or surrogate as well as nursing, discussions with consultants, evaluation of patient's response to treatment, examination of patient, obtaining history from patient or surrogate, ordering and performing treatments and interventions, ordering and review of laboratory studies, ordering and review of radiographic studies, pulse oximetry and re-evaluation of patient's condition.    CENTRAL LINE Performed by: John Walker, resident.  I was present for the procedure.  Consent: The procedure was performed in an emergent situation. Required items: required blood products, implants, devices, and special equipment available Patient identity confirmed: arm band and provided demographic data Time out: Immediately prior to procedure a "time out" was called to verify the correct patient, procedure, equipment, support staff and site/side marked as required. Indications: vascular access Anesthesia: local infiltration Local anesthetic: lidocaine 1% with epinephrine Anesthetic total: 3 ml Patient sedated: no Preparation: skin prepped with 2% chlorhexidine Skin prep agent dried: skin prep agent completely dried prior to procedure Sterile barriers: all  five maximum sterile barriers used - cap, mask, sterile gown, sterile gloves, and large sterile sheet Hand hygiene:  hand hygiene performed prior to central venous catheter insertion  Location details: right subclavian  Catheter type: triple lumen Catheter size: 8 Fr Pre-procedure: landmarks identified Ultrasound guidance: no Successful placement: yes Post-procedure: line sutured and dressing applied Assessment: blood return through all parts, free fluid flow, placement verified by x-ray and no pneumothorax on x-ray Patient tolerance: Patient tolerated the procedure well with no immediate complications.   John Maw Bane Hagy, DO 12/06/13 [phone]

## 2013-12-05 NOTE — Code Documentation (Signed)
Patient's initial rectal temperature was 86.1.

## 2013-12-05 NOTE — ED Notes (Signed)
Md speaking to family in consultation room at this time.

## 2013-12-05 NOTE — Code Documentation (Signed)
ED resident preparing for Emergency central line placement.

## 2013-12-05 NOTE — Code Documentation (Signed)
RT preparing to place Arterial Line.

## 2013-12-05 NOTE — Progress Notes (Signed)
This note also relates to the following rows which could not be included: BP - Cannot attach notes to unvalidated device data MAP (mmHg) - Cannot attach notes to unvalidated device data Pulse Rate - Cannot attach notes to unvalidated device data ECG Heart Rate - Cannot attach notes to unvalidated device data Resp - Cannot attach notes to unvalidated device data SpO2 - Cannot attach notes to   Temp. 28.8 C

## 2013-12-06 DIAGNOSIS — E872 Acidosis, unspecified: Secondary | ICD-10-CM | POA: Diagnosis not present

## 2013-12-06 DIAGNOSIS — R7309 Other abnormal glucose: Secondary | ICD-10-CM | POA: Diagnosis not present

## 2013-12-06 DIAGNOSIS — J9600 Acute respiratory failure, unspecified whether with hypoxia or hypercapnia: Secondary | ICD-10-CM | POA: Diagnosis present

## 2013-12-06 DIAGNOSIS — G931 Anoxic brain damage, not elsewhere classified: Secondary | ICD-10-CM | POA: Diagnosis present

## 2013-12-06 DIAGNOSIS — T68XXXA Hypothermia, initial encounter: Secondary | ICD-10-CM | POA: Diagnosis not present

## 2013-12-06 DIAGNOSIS — I517 Cardiomegaly: Secondary | ICD-10-CM | POA: Diagnosis not present

## 2013-12-06 DIAGNOSIS — I469 Cardiac arrest, cause unspecified: Secondary | ICD-10-CM | POA: Diagnosis not present

## 2013-12-06 LAB — POCT I-STAT, CHEM 8
BUN: 16 mg/dL (ref 6–23)
BUN: 16 mg/dL (ref 6–23)
BUN: 19 mg/dL (ref 6–23)
CALCIUM ION: 0.97 mmol/L — AB (ref 1.12–1.23)
CHLORIDE: 104 meq/L (ref 96–112)
CREATININE: 1.1 mg/dL (ref 0.50–1.35)
Calcium, Ion: 0.91 mmol/L — ABNORMAL LOW (ref 1.12–1.23)
Calcium, Ion: 0.99 mmol/L — ABNORMAL LOW (ref 1.12–1.23)
Chloride: 108 mEq/L (ref 96–112)
Chloride: 108 mEq/L (ref 96–112)
Creatinine, Ser: 1 mg/dL (ref 0.50–1.35)
Creatinine, Ser: 1 mg/dL (ref 0.50–1.35)
GLUCOSE: 204 mg/dL — AB (ref 70–99)
Glucose, Bld: 127 mg/dL — ABNORMAL HIGH (ref 70–99)
Glucose, Bld: 246 mg/dL — ABNORMAL HIGH (ref 70–99)
HCT: 49 % (ref 39.0–52.0)
HEMATOCRIT: 47 % (ref 39.0–52.0)
HEMATOCRIT: 46 % (ref 39.0–52.0)
HEMOGLOBIN: 16 g/dL (ref 13.0–17.0)
HEMOGLOBIN: 16.7 g/dL (ref 13.0–17.0)
Hemoglobin: 15.6 g/dL (ref 13.0–17.0)
POTASSIUM: 3.7 meq/L (ref 3.7–5.3)
Potassium: 2.6 mEq/L — CL (ref 3.7–5.3)
Potassium: 5 mEq/L (ref 3.7–5.3)
SODIUM: 148 meq/L — AB (ref 137–147)
Sodium: 145 mEq/L (ref 137–147)
Sodium: 145 mEq/L (ref 137–147)
TCO2: 18 mmol/L (ref 0–100)
TCO2: 20 mmol/L (ref 0–100)
TCO2: 21 mmol/L (ref 0–100)

## 2013-12-06 LAB — BASIC METABOLIC PANEL
BUN: 13 mg/dL (ref 6–23)
BUN: 17 mg/dL (ref 6–23)
BUN: 18 mg/dL (ref 6–23)
BUN: 15 mg/dL (ref 6–23)
BUN: 17 mg/dL (ref 6–23)
BUN: 15 mg/dL (ref 6–23)
CALCIUM: 7 mg/dL — AB (ref 8.4–10.5)
CALCIUM: 7 mg/dL — AB (ref 8.4–10.5)
CALCIUM: 7.2 mg/dL — AB (ref 8.4–10.5)
CALCIUM: 7 mg/dL — AB (ref 8.4–10.5)
CHLORIDE: 106 meq/L (ref 96–112)
CHLORIDE: 103 meq/L (ref 96–112)
CO2: 10 mEq/L — CL (ref 19–32)
CO2: 21 meq/L (ref 19–32)
CO2: 20 mEq/L (ref 19–32)
CO2: 26 mEq/L (ref 19–32)
CO2: 19 meq/L (ref 19–32)
CO2: 26 mEq/L (ref 19–32)
CREATININE: 0.9 mg/dL (ref 0.50–1.35)
CREATININE: 0.92 mg/dL (ref 0.50–1.35)
CREATININE: 0.93 mg/dL (ref 0.50–1.35)
Calcium: 7.1 mg/dL — ABNORMAL LOW (ref 8.4–10.5)
Calcium: 7.1 mg/dL — ABNORMAL LOW (ref 8.4–10.5)
Chloride: 100 mEq/L (ref 96–112)
Chloride: 101 mEq/L (ref 96–112)
Chloride: 103 mEq/L (ref 96–112)
Chloride: 101 mEq/L (ref 96–112)
Creatinine, Ser: 1.11 mg/dL (ref 0.50–1.35)
Creatinine, Ser: 0.83 mg/dL (ref 0.50–1.35)
Creatinine, Ser: 0.84 mg/dL (ref 0.50–1.35)
GFR calc Af Amer: 89 mL/min — ABNORMAL LOW (ref >90)
GFR calc Af Amer: >90 mL/min (ref >90)
GFR calc Af Amer: >90 mL/min (ref >90)
GFR calc Af Amer: >90 mL/min (ref >90)
GFR calc non Af Amer: >90 mL/min (ref >90)
GFR calc non Af Amer: >90 mL/min (ref >90)
GFR calc non Af Amer: >90 mL/min (ref >90)
GFR, EST NON AFRICAN AMERICAN: 77 mL/min — AB (ref >90)
GLUCOSE: 119 mg/dL — AB (ref 70–99)
GLUCOSE: 181 mg/dL — AB (ref 70–99)
GLUCOSE: 99 mg/dL (ref 70–99)
Glucose, Bld: 264 mg/dL — ABNORMAL HIGH (ref 70–99)
Glucose, Bld: 174 mg/dL — ABNORMAL HIGH (ref 70–99)
Glucose, Bld: 94 mg/dL (ref 70–99)
POTASSIUM: 6.1 meq/L — AB (ref 3.7–5.3)
Potassium: 3 mEq/L — ABNORMAL LOW (ref 3.7–5.3)
Potassium: 3.9 mEq/L (ref 3.7–5.3)
Potassium: 3.1 mEq/L — ABNORMAL LOW (ref 3.7–5.3)
Potassium: 3.2 mEq/L — ABNORMAL LOW (ref 3.7–5.3)
Potassium: 3.7 mEq/L (ref 3.7–5.3)
SODIUM: 144 meq/L (ref 137–147)
Sodium: 141 mEq/L (ref 137–147)
Sodium: 146 mEq/L (ref 137–147)
Sodium: 145 mEq/L (ref 137–147)
Sodium: 143 mEq/L (ref 137–147)
Sodium: 142 mEq/L (ref 137–147)

## 2013-12-06 LAB — GLUCOSE, CAPILLARY
GLUCOSE-CAPILLARY: 97 mg/dL (ref 70–99)
GLUCOSE-CAPILLARY: 83 mg/dL (ref 70–99)
GLUCOSE-CAPILLARY: 109 mg/dL — AB (ref 70–99)
GLUCOSE-CAPILLARY: 152 mg/dL — AB (ref 70–99)
GLUCOSE-CAPILLARY: 105 mg/dL — AB (ref 70–99)
Glucose-Capillary: 124 mg/dL — ABNORMAL HIGH (ref 70–99)
Glucose-Capillary: 147 mg/dL — ABNORMAL HIGH (ref 70–99)
Glucose-Capillary: 172 mg/dL — ABNORMAL HIGH (ref 70–99)
Glucose-Capillary: 177 mg/dL — ABNORMAL HIGH (ref 70–99)
Glucose-Capillary: 187 mg/dL — ABNORMAL HIGH (ref 70–99)
Glucose-Capillary: 209 mg/dL — ABNORMAL HIGH (ref 70–99)
Glucose-Capillary: 239 mg/dL — ABNORMAL HIGH (ref 70–99)
Glucose-Capillary: 281 mg/dL — ABNORMAL HIGH (ref 70–99)
Glucose-Capillary: 292 mg/dL — ABNORMAL HIGH (ref 70–99)
Glucose-Capillary: 98 mg/dL (ref 70–99)

## 2013-12-06 LAB — BLOOD GAS, ARTERIAL
Acid-base deficit: 1.8 mmol/L (ref 0.0–2.0)
Bicarbonate: 21.6 mEq/L (ref 20.0–24.0)
DRAWN BY: 33176
FIO2: 0.5 %
LHR: 26 {breaths}/min
O2 Saturation: 98.3 %
PCO2 ART: 31.2 mmHg — AB (ref 35.0–45.0)
PEEP/CPAP: 5 cmH2O
PH ART: 7.454 — AB (ref 7.350–7.450)
PO2 ART: 86.4 mmHg (ref 80.0–100.0)
Patient temperature: 98.6
TCO2: 22.5 mmol/L (ref 0–100)
VT: 520 mL

## 2013-12-06 LAB — POCT I-STAT 3, ART BLOOD GAS (G3+)
Acid-Base Excess: 4 mmol/L — ABNORMAL HIGH (ref 0.0–2.0)
Bicarbonate: 27.6 mEq/L — ABNORMAL HIGH (ref 20.0–24.0)
O2 SAT: 98 %
PH ART: 7.495 — AB (ref 7.350–7.450)
PO2 ART: 91 mmHg (ref 80.0–100.0)
Patient temperature: 35.4
TCO2: 29 mmol/L (ref 0–100)
pCO2 arterial: 35.3 mmHg (ref 35.0–45.0)

## 2013-12-06 LAB — TROPONIN I
TROPONIN I: 0.4 ng/mL — AB (ref <0.30)
Troponin I: 1.94 ng/mL (ref <0.30)
Troponin I: 3.75 ng/mL (ref <0.30)

## 2013-12-06 LAB — CK TOTAL AND CKMB (NOT AT ARMC)
CK TOTAL: 254 U/L — AB (ref 7–232)
CK, MB: 13 ng/mL — AB (ref 0.3–4.0)
CK, MB: 19 ng/mL (ref 0.3–4.0)
CK, MB: 19.5 ng/mL (ref 0.3–4.0)
CK, MB: 22.5 ng/mL — AB (ref 0.3–4.0)
RELATIVE INDEX: 5.3 — AB (ref 0.0–2.5)
RELATIVE INDEX: 7.4 — AB (ref 0.0–2.5)
Relative Index: 8.9 — ABNORMAL HIGH (ref 0.0–2.5)
Relative Index: 7.7 — ABNORMAL HIGH (ref 0.0–2.5)
Total CK: 244 U/L — ABNORMAL HIGH (ref 7–232)
Total CK: 265 U/L — ABNORMAL HIGH (ref 7–232)
Total CK: 247 U/L — ABNORMAL HIGH (ref 7–232)

## 2013-12-06 LAB — CBC
HCT: 43.8 % (ref 39.0–52.0)
Hemoglobin: 15.4 g/dL (ref 13.0–17.0)
MCH: 31 pg (ref 26.0–34.0)
MCHC: 35.2 g/dL (ref 30.0–36.0)
MCV: 88.1 fL (ref 78.0–100.0)
PLATELETS: 163 10*3/uL (ref 150–400)
RBC: 4.97 MIL/uL (ref 4.22–5.81)
RDW: 13.4 % (ref 11.5–15.5)
WBC: 17.3 10*3/uL — AB (ref 4.0–10.5)

## 2013-12-06 LAB — APTT: aPTT: 29 seconds (ref 24–37)

## 2013-12-06 LAB — PHOSPHORUS: PHOSPHORUS: 0.9 mg/dL — AB (ref 2.3–4.6)

## 2013-12-06 LAB — MAGNESIUM: MAGNESIUM: 1.6 mg/dL (ref 1.5–2.5)

## 2013-12-06 LAB — MRSA PCR SCREENING: MRSA BY PCR: NEGATIVE

## 2013-12-06 LAB — PROTIME-INR
INR: 1.25 (ref 0.00–1.49)
Prothrombin Time: 15.4 seconds — ABNORMAL HIGH (ref 11.6–15.2)

## 2013-12-06 MED ORDER — ASPIRIN 300 MG RE SUPP
300.0000 mg | Freq: Once | RECTAL | Status: AC
  Administered 2013-12-06: 300 mg via RECTAL
  Filled 2013-12-06: qty 1

## 2013-12-06 MED ORDER — SODIUM CHLORIDE 0.9 % IV SOLN
20.0000 ug/h | INTRAVENOUS | Status: DC
  Administered 2013-12-06: 50 ug/h via INTRAVENOUS
  Filled 2013-12-06: qty 50

## 2013-12-06 MED ORDER — INSULIN GLARGINE 100 UNIT/ML ~~LOC~~ SOLN
10.0000 [IU] | SUBCUTANEOUS | Status: DC
  Administered 2013-12-06: 10 [IU] via SUBCUTANEOUS
  Filled 2013-12-06 (×2): qty 0.1

## 2013-12-06 MED ORDER — BIOTENE DRY MOUTH MT LIQD
15.0000 mL | OROMUCOSAL | Status: DC
  Administered 2013-12-06 – 2013-12-08 (×16): 15 mL via OROMUCOSAL

## 2013-12-06 MED ORDER — CHLORHEXIDINE GLUCONATE 0.12 % MT SOLN
15.0000 mL | Freq: Two times a day (BID) | OROMUCOSAL | Status: DC
  Administered 2013-12-06 – 2013-12-08 (×6): 15 mL via OROMUCOSAL
  Filled 2013-12-06 (×5): qty 15

## 2013-12-06 MED ORDER — POTASSIUM CHLORIDE 10 MEQ/100ML IV SOLN: 10.0000 meq | INTRAVENOUS | Status: DC

## 2013-12-06 MED ORDER — MAGNESIUM SULFATE 40 MG/ML IJ SOLN
2.0000 g | Freq: Once | INTRAMUSCULAR | Status: AC
  Administered 2013-12-06: 2 g via INTRAVENOUS
  Filled 2013-12-06: qty 50

## 2013-12-06 MED ORDER — POTASSIUM CHLORIDE 10 MEQ/50ML IV SOLN
10.0000 meq | INTRAVENOUS | Status: AC
  Administered 2013-12-06 (×4): 10 meq via INTRAVENOUS
  Filled 2013-12-06 (×4): qty 50

## 2013-12-06 MED ORDER — POTASSIUM PHOSPHATE DIBASIC 3 MMOLE/ML IV SOLN
20.0000 mmol | Freq: Once | INTRAVENOUS | Status: AC
  Administered 2013-12-06: 20 mmol via INTRAVENOUS
  Filled 2013-12-06: qty 6.67

## 2013-12-06 MED ORDER — INSULIN ASPART 100 UNIT/ML ~~LOC~~ SOLN
2.0000 [IU] | SUBCUTANEOUS | Status: DC
  Administered 2013-12-06: 4 [IU] via SUBCUTANEOUS
  Administered 2013-12-06 – 2013-12-07 (×3): 2 [IU] via SUBCUTANEOUS

## 2013-12-06 MED ORDER — DEXTROSE 5 % IV SOLN
2.0000 ug/min | INTRAVENOUS | Status: DC
  Administered 2013-12-06: 40 ug/min via INTRAVENOUS
  Administered 2013-12-06: 15 ug/min via INTRAVENOUS
  Filled 2013-12-06 (×3): qty 16

## 2013-12-06 MED ORDER — SODIUM CHLORIDE 0.9 % IV SOLN
1.0000 g | Freq: Once | INTRAVENOUS | Status: AC
  Administered 2013-12-06: 1 g via INTRAVENOUS
  Filled 2013-12-06: qty 10

## 2013-12-06 MED ORDER — DEXTROSE 10 % IV SOLN: INTRAVENOUS | Status: DC | PRN

## 2013-12-06 MED FILL — Medication: Qty: 1 | Status: AC

## 2013-12-06 NOTE — Progress Notes (Signed)
Chaplain requested to offer family support in ED. Presented to pt's large family including parents, two adult children, a sister, and a cousin, in consultation room B, offering emotional and spiritual support and hospitality. Chaplain was with children and parents in trauma room B to visit pt, where chaplain listened empathically to their concerns, feelings, and attempts to process events of the day. Chaplain escorted family to [facility] waiting area, confirmed that pt's RN was aware of family's presence, and explained to family that they would be contacted when they could come visit pt. Chaplain explained where cafeteria and [employer] are located. Family was supported by their minister.   Chaplain provided crisis support, hospitality, caring presence, emotional and spiritual support, and empathic listening. Family was very grateful for chaplain presence and assistance.   Maurene CapesHillary D Irusta [phone]

## 2013-12-06 NOTE — Progress Notes (Signed)
Pt changed to limited code, no CPR no defib/cardioversion per Dr Molli KnockYacoub

## 2013-12-06 NOTE — Progress Notes (Signed)
eLink Physician-Brief Progress Note Patient Name: John McburneyGlenn A Walker DOB: 04-25-1966 MRN: [id number]  Date of Service  12/06/2013   HPI/Events of Note   Hypokalemia per nurse with Istat 2.6  eICU Interventions  Replacement ordered.  BMP pending      Henry RusselSMITH, Avier Jech, Demetrius Charity 12/06/2013, 4:16 AM

## 2013-12-06 NOTE — Progress Notes (Signed)
Rewarming initiated/Dr Molli KnockYacoub.

## 2013-12-06 NOTE — Progress Notes (Signed)
eLink Physician-Brief Progress Note Patient Name: Trude McburneyGlenn A Piscopo DOB: [birth year] MRN: [id number]  Date of Service  12/06/2013   HPI/Events of Note  Low Magnesium and low phos   eICU Interventions  Replacement ordered      Henry RusselSMITH, Decarlos Empey, P 12/06/2013, 5:20 AM

## 2013-12-06 NOTE — Progress Notes (Signed)
  Echocardiogram 2D Echocardiogram has been performed.  Georgian CoWILLIAMS, Joana Nolton 12/06/2013, 11:28 AM

## 2013-12-06 NOTE — Progress Notes (Signed)
A line getting more postional w/ damping waveform at times-MD and RT made aware.

## 2013-12-06 NOTE — Progress Notes (Signed)
PULMONARY  / CRITICAL CARE MEDICINE PROGRESS NOTE  Name: John McburneyGlenn A Uhls MRN: [id number] DOB: 05-29-1966    ADMISSION DATE:  11/28/2013  CHIEF COMPLAINT:  Cardiac arrest  BRIEF PATIENT DESCRIPTION:  48 yo male with h/o cocaine and alcohol abuse found in car, in cardiac arrest upon EMS arrival requiring ACLS x 2 with ROSC.  SIGNIFICANT EVENTS / STUDIES:  1. Cardiac arrest 11/28/2013 2. Endotracheal intubation 11/28/2013 3. CT Head 11/28/2013 - global loss of normal gray-white differentiation most concerning for anoxic injury  LINES / TUBES: 1. OETT 11/28/2013 >>> 2. R subclavian 11/28/2013>>> 3. Foley catheter 11/28/2013>>>  CULTURES: 1. Blood cultures x 2 11/28/2013>>> 2. Tracheal aspirate culture 11/28/2013>>>  ANTIBIOTICS: 1. Vancomycin 11/28/2013>>> 2. Cefepime 11/28/2013>>>  PHYSICAL EXAM  VITAL SIGNS: Temp:  [86.9 F (30.5 C)-91.4 F (33 C)] 91.4 F (33 C) (02/01 0700) Pulse Rate:  [38-84] 56 (02/01 0700) Resp:  [12-30] 26 (02/01 0700) BP: (54-223)/(36-135) 100/74 mmHg (02/01 0700) SpO2:  [96 %-100 %] 98 % (02/01 0700) FiO2 (%):  [40 %-100 %] 40 % (02/01 0700) Weight:  [81.647 kg (180 lb)-87 kg (191 lb 12.8 oz)] 87 kg (191 lb 12.8 oz) (02/01 0446)  HEMODYNAMICS: CVP:  [12 mmHg-15 mmHg] 15 mmHg  VENTILATOR SETTINGS: Vent Mode:  [-] PRVC FiO2 (%):  [40 %-100 %] 40 % Set Rate:  [16 bmp-26 bmp] 22 bmp Vt Set:  [520 mL] 520 mL PEEP:  [5 cmH20] 5 cmH20 Plateau Pressure:  [14 cmH20-20 cmH20] 17 cmH20  INTAKE / OUTPUT: Intake/Output     01/31 0701 - 02/01 0700 02/01 0701 - 02/02 0700   I.V. (mL/kg) 5605.8 (64.4)    IV Piggyback 528    Total Intake(mL/kg) 6133.8 (70.5)    Urine (mL/kg/hr) 3265    Emesis/NG output 450    Total Output 3715     Net +2418.8            PHYSICAL EXAMINATION: General:  Appearing older than stated age, no acute distress, intubated and paralyzed Neuro:  Unresponsive HEENT:  AT/[place], pupils fixed and dilated, mmm, ETT in place Neck:   Supple Cardiovascular:  RRR, no murmurs/rubs/gallops Lungs:  Coarse breath sounds bilaterally Abdomen:  +BS, soft, NT, ND Musculoskeletal:  No clubbing or cyanosis Skin:  No rash  LABS:  CBC Recent Labs     Aug 28, 2014  1909   12/06/13  0211  12/06/13  0405  12/06/13  0440  WBC  8.0   --    --    --   17.3*  HGB  13.4   < >  16.0  15.6  15.4  HCT  41.6   < >  47.0  46.0  43.8  PLT  89*   --    --    --   163   < > = values in this interval not displayed.    Coag's Recent Labs     Aug 28, 2014  2215  12/06/13  0414  APTT  34  29  INR  1.23  1.25    BMET Recent Labs     Aug 28, 2014  2215   12/06/13  0001   12/06/13  0211  12/06/13  0405  12/06/13  0415  NA  144   < >  141   < >  145  148*  146  K  5.4*   < >  6.1*   < >  3.7  2.6*  3.0*  CL  99   < >  100   < >  108  104  106  CO2  9*   --   10*   --    --    --   19  BUN  13   < >  15   < >  19  16  17   CREATININE  1.18   < >  1.11   < >  1.00  1.00  0.90  GLUCOSE  321*   < >  264*   < >  204*  127*  119*   < > = values in this interval not displayed.    Electrolytes Recent Labs     12-21-2013  2215  12/06/13  0001  12/06/13  0415  CALCIUM  6.9*  7.0*  7.1*  MG   --    --   1.6  PHOS   --    --   0.9*    Sepsis Markers No results found for this basename: LACTICACIDVEN, PROCALCITON, O2SATVEN,  in the last 72 hours  ABG Recent Labs     12/21/2013  1926  2013/12/21  2312  12/06/13  0445  PHART  6.752*  7.198*  7.454*  PCO2ART  57.5*  27.6*  31.2*  PO2ART  321.0*  124.0*  86.4    Liver Enzymes Recent Labs     12-21-13  1909  AST  333*  ALT  496*  ALKPHOS  43  BILITOT  0.2*  ALBUMIN  2.7*    Cardiac Enzymes Recent Labs     12/06/13  0001  12/06/13  0415  TROPONINI  0.40*  1.94*    Glucose Recent Labs     21-Dec-2013  2158  12/21/13  2313  12/06/13  0005  GLUCAP  292*  281*  239*    Imaging Ct Head Wo Contrast  12-21-2013   CLINICAL DATA:  Status post cardiac arrest  EXAM: CT HEAD WITHOUT  CONTRAST  TECHNIQUE: Contiguous axial images were obtained from the base of the skull through the vertex without intravenous contrast.  COMPARISON:  None.  FINDINGS: There is no evidence of mass effect, midline shift or extra-axial fluid collections. There is no evidence of a space-occupying lesion or intracranial hemorrhage. There is global loss of the normal gray-white differentiation.  The ventricles and sulci are appropriate for the patient's age. The basal cisterns are patent.  Visualized portions of the orbits are unremarkable. There is bilateral ethmoid sinus mucosal thickening. There is a left maxillary sinus mucous retention cyst.  The osseous structures are unremarkable.  IMPRESSION: Global loss of normal gray-white differentiation most concerning for anoxic injury.   Electronically Signed   By: Elige Ko   On: 12-21-2013 20:54   Dg Chest Port 1 View  12/06/2013   CLINICAL DATA:  Endotracheal tube placement.  EXAM: PORTABLE CHEST - 1 VIEW  COMPARISON:  12-21-2013  FINDINGS: Endotracheal tube tip projects 4.5 cm above the carina.  Nasogastric tube passes below the diaphragm into the stomach. A right subclavian central venous line has its tip extending across the midline to into the left brachiocephalic vein.  Mild left lung base atelectasis. Lungs are otherwise clear. Normal heart, mediastinum hila.  IMPRESSION: 1. Endotracheal tube tip now projects 4.5 cm above the carina, retracted when compared the prior exam. 2. New nasogastric tube passes below the diaphragm into the stomach. 3. Right subclavian central venous line is stable with its tip in the central left brachycephalic vein. 4. No acute findings  in the lungs.   Electronically Signed   By: Amie Portland M.D.   On: 12/06/2013 07:16   Dg Chest Port 1 View  11/30/2013   CLINICAL DATA:  Line placement and endotracheal tube  EXAM: PORTABLE CHEST - 1 VIEW  COMPARISON:  None.  FINDINGS: Heart size upper normal to mildly enlarged likely exaggerated  by limited inspiratory effect. Endotracheal tube extends into the right main bronchus. Right subclavian central line crosses midline into the left brachiocephalic vein. No pneumothorax. No consolidation.  IMPRESSION: Central line crosses midline as described above into the left brachiocephalic vein.  Endotracheal tube should be retracted approximately 4 cm, with subsequent repetition of chest radiograph 2 verify appropriate positioning.  Critical Value/emergent results were called by telephone at the time of interpretation on 11/18/2013 at 8:17 PM to Dr. Melene Plan, who verbally acknowledged these results.   Electronically Signed   By: Esperanza Heir M.D.   On: 11/13/2013 20:17    EKG: NSR 53 bpm, J wave present, QTc 562   ASSESSMENT / PLAN: 48 yo male with substance abuse history found in car and presenting with cardiac arrest. Patient with urine drug screen positive for cocaine, opiates and ethanol. He was noted to be hypothermic, hypotensive and with severe acidosis requiring bicarb gtt. CT Head also concerning for anoxic brain injury.  Active Problems:   Cardiac arrest   PULMONARY A/P: 1. Respiratory failure related to cardiac arrest: now on mechanical ventilation  Continue full vent support.  Decrease RR to 22 given respiratory alkalosis now  Monitor ABGs  VAP prevention bundle  CARDIOVASCULAR A/P:  1.   Cardiac arrest: instigating event unclear, could be related to ingestion, hypothermia, possible aspiration.  Patient's WBC is elevated, purulent material being removed from ET tube indicative of infection.  The patient has evidence of anoxic injury.  I spoke with mother extensively, patient would have never wanted to be on life support and did not even like to be in the hospital.  He has been struggling with drug abuse for a very longtime.  She is clear that this is not his wishes.  Given evidence of anoxia on CT and pupils being fixed and dilated the family only wishes to know what  the neuro prognosis is.  I discussed this at length with the patient's mother who talked to his son and based on that, decision is made to stop the hypothermia protocol (sepsis, unknown downtime, PEA, hypotension) and allow for sedation to dissipate and evaluate neuro status.  See neuro section.  D/C hypothermia protocol  Consider cardiology consult appreciated, nothing to offer. 2.   Hypotension: likely related to cardiac arrest  Titrate levophed and vasopressin.  Echocardiogram per cards. 3.   Bradycardia: suspect related to hypothermia will d/c hypothermia protocol.  RENAL A/P:  1. Severe acidosis: related to cardiac arrest with elevated lactic acidosis, now improving with respiratory compensation.  Continue bicarb drip for now.  Continue to monitor serum lactate and ABG 2. Acute kidney injury, likely related to cardiac arrest  Continue to monitor closely  Strict I/O  Avoid nephrotoxic agents and renally dose medications  Replace electrolytes as needed.  BMET in AM.  GASTROINTESTINAL A/P:  1. Elevated liver enzymes: likely related to cardiac arrest and etoh abuse.  Continue to monitor  HEMATOLOGIC A/P:   1. DVT prophylaxis with heparin given elevated Cr  INFECTIOUS A/P: 1. Sepsis given hypotension, aspiration and with unknown events preceding cardiac arrest  Trach aspirate cultures, blood cultures  UA  neg  Continue on vancomycin and cefepime.  ENDOCRINE A/P: 1. Hyperglycemia  Insulin gtt  NEUROLOGIC A/P: 1. Encephalopathy: concern for anoxic brain injury given CT findings  D/C hypothermia protocol  D/C sedation  EEG.  Neuro consult once warmed and sedation is off  Continue to monitor closely 2.   History of alcohol abuse  Monitor for signs of alcohol withdrawal  Started on multivitamin and thiamine  See family discussion above, will d/c hypothermia protocol and sedation to evaluate neuro status.  Family wishes for no CPR and no  cardioversion, if will not recover then comfort unless brain dead which will be evaluated after patient is rewarmed.  I have personally obtained a history, examined the patient, evaluated laboratory and imaging results, formulated the assessment and plan and placed orders.  CRITICAL CARE: The patient is critically ill with multiple organ systems failure and requires high complexity decision making for assessment and support, frequent evaluation and titration of therapies, application of advanced monitoring technologies and extensive interpretation of multiple databases. Critical Care Time devoted to patient care services described in this note is 35 minutes.   Alyson Reedy, M.D. [facility] Pulmonary/Critical Care Medicine. Pager: [phone]. After hours pager: [phone].

## 2013-12-06 DEATH — deceased

## 2013-12-07 ENCOUNTER — Inpatient Hospital Stay (HOSPITAL_COMMUNITY): Payer: Non-veteran care

## 2013-12-07 ENCOUNTER — Encounter (HOSPITAL_COMMUNITY): Payer: Self-pay | Admitting: *Deleted

## 2013-12-07 DIAGNOSIS — D696 Thrombocytopenia, unspecified: Secondary | ICD-10-CM | POA: Diagnosis present

## 2013-12-07 DIAGNOSIS — J189 Pneumonia, unspecified organism: Secondary | ICD-10-CM | POA: Diagnosis present

## 2013-12-07 DIAGNOSIS — E870 Hyperosmolality and hypernatremia: Secondary | ICD-10-CM | POA: Diagnosis present

## 2013-12-07 DIAGNOSIS — R0902 Hypoxemia: Secondary | ICD-10-CM

## 2013-12-07 DIAGNOSIS — G936 Cerebral edema: Secondary | ICD-10-CM | POA: Diagnosis present

## 2013-12-07 DIAGNOSIS — T50901A Poisoning by unspecified drugs, medicaments and biological substances, accidental (unintentional), initial encounter: Secondary | ICD-10-CM | POA: Diagnosis present

## 2013-12-07 LAB — BLOOD GAS, ARTERIAL
ACID-BASE EXCESS: 4.6 mmol/L — AB (ref 0.0–2.0)
Bicarbonate: 28.4 mEq/L — ABNORMAL HIGH (ref 20.0–24.0)
Drawn by: 331761
FIO2: 0.4 %
MECHVT: 520 mL
O2 SAT: 94.5 %
PATIENT TEMPERATURE: 98.6
PEEP: 5 cmH2O
PO2 ART: 70.9 mmHg — AB (ref 80.0–100.0)
RATE: 22 resp/min
TCO2: 29.6 mmol/L (ref 0–100)
pCO2 arterial: 40.5 mmHg (ref 35.0–45.0)
pH, Arterial: 7.459 — ABNORMAL HIGH (ref 7.350–7.450)

## 2013-12-07 LAB — CBC
HEMATOCRIT: 42.8 % (ref 39.0–52.0)
Hemoglobin: 15.1 g/dL (ref 13.0–17.0)
MCH: 30.9 pg (ref 26.0–34.0)
MCHC: 35.3 g/dL (ref 30.0–36.0)
MCV: 87.5 fL (ref 78.0–100.0)
PLATELETS: 133 10*3/uL — AB (ref 150–400)
RBC: 4.89 MIL/uL (ref 4.22–5.81)
RDW: 13.7 % (ref 11.5–15.5)
WBC: 13.1 10*3/uL — AB (ref 4.0–10.5)

## 2013-12-07 LAB — BASIC METABOLIC PANEL
BUN: 11 mg/dL (ref 6–23)
CO2: 26 mEq/L (ref 19–32)
Calcium: 7.5 mg/dL — ABNORMAL LOW (ref 8.4–10.5)
Chloride: 107 mEq/L (ref 96–112)
Creatinine, Ser: 1.01 mg/dL (ref 0.50–1.35)
GFR calc non Af Amer: 86 mL/min — ABNORMAL LOW (ref >90)
Glucose, Bld: 128 mg/dL — ABNORMAL HIGH (ref 70–99)
POTASSIUM: 3.5 meq/L — AB (ref 3.7–5.3)
Sodium: 148 mEq/L — ABNORMAL HIGH (ref 137–147)

## 2013-12-07 LAB — GLUCOSE, CAPILLARY
GLUCOSE-CAPILLARY: 113 mg/dL — AB (ref 70–99)
GLUCOSE-CAPILLARY: 121 mg/dL — AB (ref 70–99)
GLUCOSE-CAPILLARY: 128 mg/dL — AB (ref 70–99)
Glucose-Capillary: 134 mg/dL — ABNORMAL HIGH (ref 70–99)
Glucose-Capillary: 137 mg/dL — ABNORMAL HIGH (ref 70–99)
Glucose-Capillary: 138 mg/dL — ABNORMAL HIGH (ref 70–99)
Glucose-Capillary: 129 mg/dL — ABNORMAL HIGH (ref 70–99)

## 2013-12-07 LAB — CK TOTAL AND CKMB (NOT AT ARMC)
CK TOTAL: 241 U/L — AB (ref 7–232)
CK, MB: 15.8 ng/mL — AB (ref 0.3–4.0)
CK, MB: 12.9 ng/mL — AB (ref 0.3–4.0)
Relative Index: 6.1 — ABNORMAL HIGH (ref 0.0–2.5)
Relative Index: 6.6 — ABNORMAL HIGH (ref 0.0–2.5)
Total CK: 210 U/L (ref 7–232)

## 2013-12-07 LAB — PHOSPHORUS: Phosphorus: 3.1 mg/dL (ref 2.3–4.6)

## 2013-12-07 LAB — MAGNESIUM: MAGNESIUM: 2 mg/dL (ref 1.5–2.5)

## 2013-12-07 MED ORDER — POTASSIUM CHLORIDE 10 MEQ/50ML IV SOLN
10.0000 meq | INTRAVENOUS | Status: AC
  Administered 2013-12-07 (×2): 10 meq via INTRAVENOUS
  Filled 2013-12-07 (×2): qty 50

## 2013-12-07 MED ORDER — VANCOMYCIN HCL 10 G IV SOLR
1250.0000 mg | Freq: Two times a day (BID) | INTRAVENOUS | Status: DC
  Administered 2013-12-07 – 2013-12-08 (×2): 1250 mg via INTRAVENOUS
  Filled 2013-12-07 (×3): qty 1250

## 2013-12-07 MED ORDER — VITAL HIGH PROTEIN PO LIQD
1000.0000 mL | ORAL | Status: DC
  Administered 2013-12-07: 1000 mL
  Filled 2013-12-07 (×3): qty 1000

## 2013-12-07 MED ORDER — SODIUM CHLORIDE 0.9 % IV SOLN: INTRAVENOUS | Status: DC

## 2013-12-07 MED ORDER — FREE WATER: 100.0000 mL | Freq: Three times a day (TID) | Status: DC

## 2013-12-07 MED ORDER — DEXTROSE 5 % IV SOLN
0.0000 ug/min | INTRAVENOUS | Status: DC
Start: 2013-12-07 — End: 2013-12-08
  Administered 2013-12-07: 20 ug/min via INTRAVENOUS
  Administered 2013-12-07: 20.053 ug/min via INTRAVENOUS
  Administered 2013-12-08 (×2): 24 ug/min via INTRAVENOUS
  Filled 2013-12-07 (×2): qty 16

## 2013-12-07 MED ORDER — FAMOTIDINE IN NACL 20-0.9 MG/50ML-% IV SOLN
20.0000 mg | Freq: Two times a day (BID) | INTRAVENOUS | Status: DC
  Administered 2013-12-08: 20 mg via INTRAVENOUS
  Filled 2013-12-07 (×2): qty 50

## 2013-12-07 MED ORDER — INSULIN ASPART 100 UNIT/ML ~~LOC~~ SOLN
0.0000 [IU] | SUBCUTANEOUS | Status: DC
  Administered 2013-12-07 – 2013-12-08 (×6): 2 [IU] via SUBCUTANEOUS

## 2013-12-07 NOTE — Progress Notes (Signed)
ANTIBIOTIC CONSULT NOTE - FOLLOW UP  Pharmacy Consult for vancomycin, cefepime Indication: sepsis  No Known Allergies  Patient Measurements: Height: 5\' 10"  (177.8 cm) Weight: 200 lb 13.4 oz (91.1 kg) IBW/kg (Calculated) : 73  Vital Signs: Temp: 98 F (36.7 C) (02/02 0838) Temp src: Oral (02/02 0838) BP: 108/61 mmHg (02/02 1000) Pulse Rate: 85 (02/02 1000) Intake/Output from previous day: 02/01 0701 - 02/02 0700 In: 3454.8 [I.V.:2334.8; IV Piggyback:1120] Out: 2440 [Urine:2440] Intake/Output from this shift: Total I/O In: 156.4 [I.V.:56.4; IV Piggyback:100] Out: 640 [Urine:640]  Labs:  Recent Labs  11/28/2013 1909  12/06/13 0405  12/06/13 0440  12/06/13 1600 12/06/13 1900 12/07/13 0400  WBC 8.0  --   --   --  17.3*  --   --   --  13.1*  HGB 13.4  < > 15.6  --  15.4  --   --   --  15.1  PLT 89*  --   --   --  163  --   --   --  133*  CREATININE 1.57*  < > 1.00  < >  --   < > 0.83 0.84 1.01  < > = values in this interval not displayed. Estimated Creatinine Clearance: 101.5 ml/min (by C-G formula based on Cr of 1.01). No results found for this basename: VANCOTROUGH, VANCOPEAK, VANCORANDOM, GENTTROUGH, GENTPEAK, GENTRANDOM, TOBRATROUGH, TOBRAPEAK, TOBRARND, AMIKACINPEAK, AMIKACINTROU, AMIKACIN,  in the last 72 hours    Assessment: 48 yo male s/p cardiac arrest (s/p hypothermia protocol) on vancomycin and cefepime for possible sepsis. WBC= 13.1 afebrile, SCr= 1.01 (noted down from 1/57 on 11/05/2013) and UOP 1.1 ml.kg/hr.  vanc 2/1>> 2/2 Cefepime 2/1>>  2/1: trach asp>> abundant GPC/pairs, few GPR and GNR 1/31: BCx2>> ngtd   Goal of Therapy:  Vancomycin trough level 15-20 mcg/ml  Plan:  -Change vancomycin to 1250mg  IV q12h -No cefepime changes  -Will follow renal function, cultures and clinical progress  Harland Germanndrew Burnette Sautter, Pharm D 12/07/2013 12:00 PM

## 2013-12-07 NOTE — Progress Notes (Signed)
EEG at bedside completed. 

## 2013-12-07 NOTE — Care Management Note (Signed)
    Page 1 of 1   12/07/2013     12:25:42 PM   CARE MANAGEMENT NOTE 12/07/2013  Patient:  John Walker,John Walker   Account Number:  [id number]  Date Initiated:  12/07/2013  Documentation initiated by:  Junius CreamerWELL,DEBBIE  Subjective/Objective Assessment:   adm w cardiac arrest, vent     Action/Plan:   lives alone   Anticipated DC Date:     Anticipated DC Plan:           Choice offered to / List presented to:             Status of service:   Medicare Important Message given?   (If response is "NO", the following Medicare IM given date fields will be blank) Date Medicare IM given:   Date Additional Medicare IM given:    Discharge Disposition:    Per UR Regulation:  Reviewed for med. necessity/level of care/duration of stay  If discussed at Long Length of Stay Meetings, dates discussed:    Comments:

## 2013-12-07 NOTE — Progress Notes (Signed)
[facility] FOR [facility] LAB REPLACEMENT ONLY  The patient does [facility] based on the criteria listed below:   1. Is GFR >/= 40 ml/min? yes  Patient's GFR today is 86 2. Is urine output >/= 0.5 ml/kg/hr for the last 6 hours? yes Patient's UOP is 1.1 ml/kg/hr 3. Is BUN < 60 mg/dL? yes Patient's BUN today is 11 4. Abnormal electrolyte(s): K+ 5. Ordered repletion with: protocol 6. If a panic level lab has been reported, has the CCM MD in charge been notified? yes.   Physician:  Tandy Gawlva  Alen Matheson Hilliard 12/07/2013 5:47 AM

## 2013-12-07 NOTE — Progress Notes (Signed)
INITIAL NUTRITION ASSESSMENT  DOCUMENTATION CODES Per approved criteria  -Not Applicable   INTERVENTION: 1.  Enteral nutrition; if advancements appropriate, recommend goal rate of Vital High Protein @ 75 mL/hr to provide 1800 kcal, 157g protein, and 1510 mL free water.   NUTRITION DIAGNOSIS: Inadequate oral intake related to inability to eat as evidenced by vent, NPO.   Monitor:  1.  Enteral nutrition; initiation with tolerance.  Pt to meet >/=90% estimated needs with nutrition support.  2.  Wt/wt change; monitor trends  Reason for Assessment: Vent  48 y.o. male  Admitting Dx: cardiac arrest  ASSESSMENT: Pt admitted with cardiac arrest.  Pt with h/o cocaine and EtOH abuse.  Per MD note, CT concerning for anoxic injury.  Hypothermia protocol d/c'd to assess brain injury.   Patient is currently intubated on ventilator support.  MV: 7.8 L/min Temp (24hrs), Avg:97.3 F (36.3 C), Min:95 F (35 C), Max:98.6 F (37 C)  Propofol: none  MD has ordered TFs for pt.  Vital High Protein to be advanced to 40 mL/hr continuous which will provide 960 kcal, 84g protein, and 796 mL free water.   Height: Ht Readings from Last 1 Encounters:  11/10/2013 5\' 10"  (1.778 m)    Weight: Wt Readings from Last 1 Encounters:  12/07/13 200 lb 13.4 oz (91.1 kg)    Ideal Body Weight: 75.4 kg  % Ideal Body Weight: 120%  Wt Readings from Last 10 Encounters:  12/07/13 200 lb 13.4 oz (91.1 kg)    Usual Body Weight: unknown  % Usual Body Weight: unable to assess  BMI:  Body mass index is 28.82 kg/(m^2).  Estimated Nutritional Needs: Kcal: 1925 Protein: 110-127g Fluid: ~2.0 L/day  Skin: Generalized edema  Diet Order:  NPO  EDUCATION NEEDS: -Education not appropriate at this time   Intake/Output Summary (Last 24 hours) at 12/07/13 1259 Last data filed at 12/07/13 1200  Gross per 24 hour  Intake 2037.1 ml  Output   3340 ml  Net -1302.9 ml    Last BM: PTA  Labs:   Recent  Labs Lab 12/06/13 0405 12/06/13 0415  12/06/13 1600 12/06/13 1900 12/07/13 0400  NA 148* 146  < > 144 142 148*  K 2.6* 3.0*  < > 3.2* 3.7 3.5*  CL 104 106  < > 103 101 107  CO2  --  19  < > 26 26 26   BUN 16 17  < > 15 13 11   CREATININE 1.00 0.90  < > 0.83 0.84 1.01  CALCIUM  --  7.1*  < > 7.0* 7.0* 7.5*  MG  --  1.6  --   --   --  2.0  PHOS  --  0.9*  --   --   --  3.1  GLUCOSE 127* 119*  < > 94 99 128*  < > = values in this interval not displayed.  CBG (last 3)   Recent Labs  12/07/13 0432 12/07/13 0859 12/07/13 1156  GLUCAP 121* 134* 128*    Scheduled Meds: . antiseptic oral rinse  15 mL Mouth Rinse Q4H  . ceFEPime (MAXIPIME) IV  1 g Intravenous Q8H  . chlorhexidine  15 mL Mouth/Throat BID  . famotidine (PEPCID) IV  20 mg Intravenous Q12H  . feeding supplement (VITAL HIGH PROTEIN)  1,000 mL Per Tube Q24H  . free water  100 mL Per Tube Q8H  . heparin  5,000 Units Subcutaneous Q8H  . insulin aspart  0-15 Units Subcutaneous Q4H  .  vancomycin  1,250 mg Intravenous Q12H    Continuous Infusions: . norepinephrine (LEVOPHED) Adult infusion 20 mcg/min (12/07/13 1013)    History reviewed. No pertinent past medical history.  History reviewed. No pertinent past surgical history.  Loyce DysKacie Skye Rodarte, MS RD LDN Clinical Inpatient Dietitian Pager: [phone] Weekend/After hours pager: [phone]

## 2013-12-07 NOTE — Progress Notes (Signed)
PULMONARY  / CRITICAL CARE MEDICINE PROGRESS NOTE  Name: John Walker MRN: [id number] DOB: 1966-07-24    ADMISSION DATE:  11/14/2013  CHIEF COMPLAINT:  Cardiac arrest  BRIEF PATIENT DESCRIPTION:  48 yo male with h/o cocaine and alcohol abuse found in car, in cardiac arrest upon EMS arrival requiring ACLS x 2 with ROSC.  SIGNIFICANT EVENTS / STUDIES:  1/31 Adm after cardiopulmonary arrest 1/31 CT Head: global loss of normal gray-white differentiation most concerning for anoxic injury  LINES / TUBES: ETT 1/31 >>  R [place] CVL 1/31 >>   CULTURES: Blood 1/31 >>  Resp 1/31 >> abundant GPCs in pairs, few GNRs  ANTIBIOTICS: Vancomycin 1/31 >>  Cefepime 1/31 >>    VITAL SIGNS: Temp:  [96.3 F (35.7 C)-98.6 F (37 C)] 98.2 F (36.8 C) (02/02 1200) Pulse Rate:  [79-111] 81 (02/02 1300) Resp:  [7-22] 15 (02/02 1300) BP: (79-137)/(48-93) 102/61 mmHg (02/02 1300) SpO2:  [90 %-99 %] 93 % (02/02 1300) FiO2 (%):  [40 %] 40 % (02/02 1245) Weight:  [91.1 kg (200 lb 13.4 oz)] 91.1 kg (200 lb 13.4 oz) (02/02 0438)  HEMODYNAMICS: CVP:  [9 mmHg-13 mmHg] 10 mmHg  VENTILATOR SETTINGS: Vent Mode:  [-] PRVC FiO2 (%):  [40 %] 40 % Set Rate:  [15 bmp-22 bmp] 15 bmp Vt Set:  [520 mL] 520 mL PEEP:  [5 cmH20] 5 cmH20 Plateau Pressure:  [20 cmH20-22 cmH20] 20 cmH20  INTAKE / OUTPUT: Intake/Output     02/01 0701 - 02/02 0700 02/02 0701 - 02/03 0700   I.V. (mL/kg) 2334.8 (25.6) 131.6 (1.4)   IV Piggyback 1120 250   Total Intake(mL/kg) 3454.8 (37.9) 381.6 (4.2)   Urine (mL/kg/hr) 2440 (1.1) 990 (1.3)   Emesis/NG output     Total Output 2440 990   Net +1014.8 -608.4          PHYSICAL EXAMINATION: General: Comatose Neuro:  Minimally responsive, pupils fixed @ 5-6 mm, no spontaneous movement HEENT: NCAT Neck:  Supple Cardiovascular:  RRR s M Lungs:  Coarse breath sounds bilaterally Abdomen:  +BS, soft, NT, ND Musculoskeletal:  No clubbing or cyanosis Skin:  No rash  LABS: BMET     Component Value Date/Time   NA 148* 12/07/2013 0400   K 3.5* 12/07/2013 0400   CL 107 12/07/2013 0400   CO2 26 12/07/2013 0400   GLUCOSE 128* 12/07/2013 0400   BUN 11 12/07/2013 0400   CREATININE 1.01 12/07/2013 0400   CALCIUM 7.5* 12/07/2013 0400   GFRNONAA 86* 12/07/2013 0400   GFRAA >90 12/07/2013 0400   CBC    Component Value Date/Time   WBC 13.1* 12/07/2013 0400   RBC 4.89 12/07/2013 0400   HGB 15.1 12/07/2013 0400   HCT 42.8 12/07/2013 0400   PLT 133* 12/07/2013 0400   MCV 87.5 12/07/2013 0400   MCH 30.9 12/07/2013 0400   MCHC 35.3 12/07/2013 0400   RDW 13.7 12/07/2013 0400   LYMPHSABS 3.4 11/13/2013 1909   MONOABS 0.4 12/04/2013 1909   EOSABS 0.0 11/16/2013 1909   BASOSABS 0.0 11/05/2013 1909    CXR: R>L basilar infiltrates   ASSESSMENT: Principal Problem:   Cardiac arrest Active Problems:   Acute respiratory failure   Anoxic encephalopathy   Polysubstance overdose   Cerebral edema due to anoxia   PNA (pneumonia)   Hypernatremia   Thrombocytopenia   Hyperglycemia  PLAN: Cont vent support Cont current abx Will not correct Na in setting of cerebral edema Repeat CT head and  EEG for prognostication (which is deemed to be poor) Cont SSI Begin TFs   Family updated in detail. Poor prognosis conveyed  49 mins CCM time   John Fischeravid Marlei Glomski, MD ; [facility] Mobile [phone].  After 5:30 PM or weekends, call [phone]

## 2013-12-08 ENCOUNTER — Inpatient Hospital Stay (HOSPITAL_COMMUNITY): Payer: Non-veteran care

## 2013-12-08 DIAGNOSIS — G935 Compression of brain: Secondary | ICD-10-CM | POA: Diagnosis not present

## 2013-12-08 DIAGNOSIS — G936 Cerebral edema: Secondary | ICD-10-CM

## 2013-12-08 DIAGNOSIS — G9340 Encephalopathy, unspecified: Secondary | ICD-10-CM

## 2013-12-08 DIAGNOSIS — E232 Diabetes insipidus: Secondary | ICD-10-CM

## 2013-12-08 DIAGNOSIS — J9600 Acute respiratory failure, unspecified whether with hypoxia or hypercapnia: Secondary | ICD-10-CM | POA: Diagnosis not present

## 2013-12-08 DIAGNOSIS — I469 Cardiac arrest, cause unspecified: Secondary | ICD-10-CM | POA: Diagnosis not present

## 2013-12-08 DIAGNOSIS — G931 Anoxic brain damage, not elsewhere classified: Secondary | ICD-10-CM | POA: Diagnosis not present

## 2013-12-08 LAB — CBC
HCT: 49.2 % (ref 39.0–52.0)
Hemoglobin: 16.7 g/dL (ref 13.0–17.0)
MCH: 31 pg (ref 26.0–34.0)
MCHC: 33.9 g/dL (ref 30.0–36.0)
MCV: 91.3 fL (ref 78.0–100.0)
Platelets: 92 10*3/uL — ABNORMAL LOW (ref 150–400)
RBC: 5.39 MIL/uL (ref 4.22–5.81)
RDW: 14.5 % (ref 11.5–15.5)
WBC: 9.7 10*3/uL (ref 4.0–10.5)

## 2013-12-08 LAB — CULTURE, RESPIRATORY W GRAM STAIN

## 2013-12-08 LAB — BASIC METABOLIC PANEL
BUN: 8 mg/dL (ref 6–23)
BUN: 9 mg/dL (ref 6–23)
CALCIUM: 9.3 mg/dL (ref 8.4–10.5)
CALCIUM: 9.3 mg/dL (ref 8.4–10.5)
CO2: 27 mEq/L (ref 19–32)
CO2: 30 mEq/L (ref 19–32)
CREATININE: 1.09 mg/dL (ref 0.50–1.35)
CREATININE: 1.17 mg/dL (ref 0.50–1.35)
Chloride: >130 mEq/L (ref 96–112)
GFR calc Af Amer: >90 mL/min (ref >90)
GFR calc Af Amer: 84 mL/min — ABNORMAL LOW (ref >90)
GFR calc non Af Amer: 72 mL/min — ABNORMAL LOW (ref >90)
GFR, EST NON AFRICAN AMERICAN: 79 mL/min — AB (ref >90)
GLUCOSE: 147 mg/dL — AB (ref 70–99)
Glucose, Bld: 142 mg/dL — ABNORMAL HIGH (ref 70–99)
Potassium: 2.5 mEq/L — CL (ref 3.7–5.3)
Potassium: 2.4 mEq/L — CL (ref 3.7–5.3)
SODIUM: 173 meq/L — AB (ref 137–147)
Sodium: 175 mEq/L (ref 137–147)

## 2013-12-08 LAB — CULTURE, RESPIRATORY

## 2013-12-08 LAB — GLUCOSE, CAPILLARY
Glucose-Capillary: 128 mg/dL — ABNORMAL HIGH (ref 70–99)
Glucose-Capillary: 139 mg/dL — ABNORMAL HIGH (ref 70–99)

## 2013-12-08 LAB — TROPONIN I: TROPONIN I: 0.57 ng/mL — AB (ref <0.30)

## 2013-12-08 MED ORDER — SODIUM CHLORIDE 0.9 % IV SOLN
5.0000 mg/h | INTRAVENOUS | Status: DC
  Administered 2013-12-08: 5 mg/h via INTRAVENOUS
  Filled 2013-12-08: qty 10

## 2013-12-08 MED ORDER — NOREPINEPHRINE BITARTRATE 1 MG/ML IJ SOLN
0.0000 ug/min | INTRAVENOUS | Status: DC
  Filled 2013-12-08: qty 16

## 2013-12-08 MED ORDER — MORPHINE BOLUS VIA INFUSION
5.0000 mg | INTRAVENOUS | Status: DC | PRN
  Filled 2013-12-08: qty 20

## 2013-12-08 MED ORDER — LORAZEPAM BOLUS VIA INFUSION
2.0000 mg | INTRAVENOUS | Status: DC | PRN
  Filled 2013-12-08: qty 5

## 2013-12-08 MED ORDER — POTASSIUM CHLORIDE 10 MEQ/50ML IV SOLN: 10.0000 meq | INTRAVENOUS | Status: DC

## 2013-12-08 MED ORDER — DEXTROSE-NACL 5-0.45 % IV SOLN
INTRAVENOUS | Status: DC
  Administered 2013-12-08: 250 mL/h via INTRAVENOUS

## 2013-12-12 LAB — CULTURE, BLOOD (ROUTINE X 2)
Culture: NO GROWTH
Culture: NO GROWTH

## 2013-12-31 NOTE — Discharge Summary (Addendum)
DEATH SUMMARY  DATE OF ADMISSION:  Dec 06, 2013  DATE OF DISCHARGE/DEATH:  09-Dec-2013  ADMISSION DIAGNOSES:   Cardiac arrest Polysubstance overdose H/O polysubstance abuse - EtOH, cocaine, opiates Acute respiratory failure Hypotension Metabolic acidosis AKI Elevated LFTs Possible sepsis Hyperglycemia Post anoxic encephalopathy    DISCHARGE DIAGNOSES:   Prolonged cardiac arrest secondary to polysubstance overdose Acute respiratory failure  Severe anoxic encephalopathy  Polysubstance abuse Cerebral edema due to anoxia  Brain stem herniation  Aspiration PNA  Sepsis Hypernatremia  Thrombocytopenia  Diabetes insipidus  Hyperglycemia Hypomagnesemia Hypophosphatemia Withdrawal of life sustaining therapies   PRESENTATION:   Pt was admitted with the following HPI and the above admission diagnoses:  HISTORY OF PRESENT ILLNESS:  48 yo male with h/o substance abuse (cocaine and alcohol) who presented with cardiac arrest. Per mother and father, he was found unresponsive in his car today, with unknown down time. On EMS arrival, patient found to be in cardiac arrest and required ACLS x 2 with 4 rounds of epi and with ROSC as well as intubation. On arrival to ED, patient found to be severely hypothermic, bradycardic, hypotensive and with severe acidosis. He was started on pressors and bicarb gtt and R subclavian line was placed in ED.  Family reports that the last contact they had with him was the night prior and he had had no complaints and seemed to be in his usual state of health.   HOSPITAL COURSE:   SIGNIFICANT EVENTS / STUDIES:  2022-12-06 Adm after cardiopulmonary arrest  12-06-22 CT Head: global loss of normal gray-white differentiation most concerning for anoxic injury  2/02 CT head: Worsening cerebral edema with herniation.  2/02 EEG: abnormal EEG consistent with electrocerebral silence  2/02 Developed DI and severe hypernatremia  12/09/2022 Minimal withdrawal from pain. 2-4 spontaneous  breaths per minute on apnea test  12-09-2022 Family conference. Explained that pt is very nearly brain dead. Offered option of organ donation. Family met with CDS and declined that option. Requested that he remain supported until other family and friends can arrive. Made full DNR without further titration of vasopressors.  12/09/22 After all family/friends present, vent support withdrawn. Pt expired shortly thereafter in complete comfort  LINES / TUBES:  ETT 06-Dec-2022 >>  R [place] CVL 12-06-22 >>   CULTURES:  Blood 12-06-22 >>  Resp 12/06/22 >> abundant GPCs in pairs, few GNRs   ANTIBIOTICS:  Vancomycin 12-06-2022 >> 2022-12-09  Cefepime 12/06/22 >> December 09, 2022    Cause of death:  Post anoxic cerebral edema and brainstem herniation due to prolonged cardiac arrest   Merton Border, MD ; [facility] service Mobile [phone].  After 5:30 PM or weekends, call [phone]

## 2014-01-03 NOTE — Progress Notes (Signed)
Chaplain responded to a page for withdrawal of care.  Large extended family was in waiting area while a few were at Pt bedside.  Chaplain met with pt children and former spouse and offered empathetic listening and emotional support.  Pt was non-responsive and family discussed pt comfort and needs in the final transition.  Family at bedside appeared to be emotional; pt son is especially fragile and struggling with pt health status. Chaplain offered grief support and discussed external support systems available to the family.   Chaplain is available for further consult if desired.    12/31/2013 1700  Clinical Encounter Type  Visited With Family;Patient not available;Health care provider  Visit Type Initial;Patient actively dying;Spiritual support  Referral From Nurse  Spiritual Encounters  Spiritual Needs Emotional;Grief support  Stress Factors  Patient Stress Factors None identified  Family Stress Factors Family relationships;Health changes;Major life changes    [place]

## 2014-01-03 NOTE — Procedures (Signed)
EEG report.  Brief clinical history: 48 yo male with h/o cocaine and alcohol abuse found in car, in cardiac arrest upon EMS arrival requiring ACLS x 2 with ROSC  .  Technique: this is a 17 channel routine scalp EEG performed at the bedside in the ICU setting, with bipolar and monopolar montages arranged in accordance to the international 10/20 system of electrode placement. One channel was dedicated to EKG recording.  The patient is intubated on the vent. No activating procedures performed.  Description: the study showed generalized isoelectric activity.  Impression: this is an abnormal EEG consistent with electrocerebral silence.  Clinical correlation is advised. Wyatt Portelasvaldo Camilo, MD

## 2014-01-03 NOTE — Progress Notes (Signed)
[facility] FOR AM LAB REPLACEMENT ONLY  The patient does apply [facility] the [facility] Electroly[facility] Protocol based on the criteria listed below:   1. Is GFR >/= 40 ml/min? yes  Patient's GFR today is 79 2. Is urine output >/= 0.5 ml/kg/hr for the last 6 hours? yes Patient's UOP is 0.64 ml/kg/hr 3. Is BUN < 60 mg/dL? yes  Patient's BUN today is 8 4. Abnormal electrolyte(s): K+ 2.5 5. Ordered repletion with: see order  6. If a panic level lab has been reported, has the CCM MD in charge been notified? yes.   Physician: Dr. Higinio Planeterding  John Walker A 12/17/2013 5:37 AM

## 2014-01-03 NOTE — Progress Notes (Signed)
CRITICAL VALUE ALERT  Critical value received:  Sodium 173; Potassium 2.5, Chloride >130  Date of notification:  12/07/2013  Time of notification:  0506  Critical value read back:yes  Nurse who received alert:  Toula MoosABERION, Ilene Witcher J  MD notified (1st page):  ELINK  Time of first page:  0508  Responding MD:  Pola CornELINK  Time MD responded:  [phone]

## 2014-01-03 NOTE — Progress Notes (Signed)
Death pronouncement at 1900 per two RN's P.Staton Markey RN and [place] Perkins RN

## 2014-01-03 NOTE — Progress Notes (Signed)
95 cc of Morphine wasted from a 100 cc bag of morphine. Witnessed per two RN's. Clancy GourdPamela Sarajane Fambrough RN and [place] Perkins RN

## 2014-01-03 NOTE — Progress Notes (Signed)
Respiratory therapy note-Patient extubated per order, patient and family decision.

## 2014-01-03 NOTE — Progress Notes (Addendum)
RN says family ready to wtidhraw GCS 3 but takes some breaths ove vent On pressors at this point Patient is DNAR He is a ME case but can extubate per RN  Plan Wean orders sent   Dr. Kalman ShanMurali AmeLie Hollars, M.D., [facility] Pulmonary and Critical Care Medicine Staff Physician [facility] [place] [facility] Pager: [phone], If no answer or between  15:00h - 7:00h: call [phone]  01/01/2014 4:48 PM

## 2014-01-03 NOTE — Progress Notes (Signed)
Emotional support, comfort cart and pt comfort given. [facility]. Clergy at bedside.

## 2014-01-03 NOTE — Progress Notes (Signed)
CRITICAL VALUE ALERT  Critical value received:  NA 175- K 2.4- Cl >130  Date of notification2/3/15  Time of notification:  0915  Critical value read back:yes  Nurse who received alert:  P. Rance MuirAlvord Rn  MD notified (1st page):  Dr. Sung Amabilesimonds  Time of first page:  0915  MD notified (2nd page):  Time of second page:  Responding MD:  Dr. Sung AmabileSimonds  Time MD responded:  [phone]

## 2014-01-03 NOTE — Progress Notes (Addendum)
PULMONARY  / CRITICAL CARE MEDICINE PROGRESS NOTE  Name: John Walker MRN: [id number] DOB: [birth year]    ADMISSION DATE:  11/27/2013  CHIEF COMPLAINT:  Cardiac arrest  BRIEF PATIENT DESCRIPTION:  48 yo male with h/o cocaine and alcohol abuse found in car, in cardiac arrest upon EMS arrival requiring ACLS x 2 with ROSC.  SIGNIFICANT EVENTS / STUDIES:  1/31 Adm after cardiopulmonary arrest 1/31 CT Head: global loss of normal gray-white differentiation most concerning for anoxic injury 2/02 CT head: Worsening cerebral edema with herniation. 2/02 EEG: abnormal EEG consistent with electrocerebral silence 2/02 Developed DI and severe hypernatremia 2/03 Minimal withdrawal from pain. 2-4 spontaneous breaths per minute on apnea test 2/03 Family conference. Explained that pt is very nearly brain dead. Offered option of organ donation. Family met with CDS and declined that option. Requested that he remain supported until other family and friends can arrive. Made full DNR without further titration of vasopressors.   LINES / TUBES: ETT 1/31 >>  R [place] CVL 1/31 >>   CULTURES: Blood 1/31 >>  Resp 1/31 >> abundant GPCs in pairs, few GNRs  ANTIBIOTICS: Vancomycin 1/31 >> 2/03 Cefepime 1/31 >> 2/03   VITAL SIGNS: Temp:  [97.2 F (36.2 C)-97.6 F (36.4 C)] 97.2 F (36.2 C) (02/03 0747) Pulse Rate:  [71-95] 82 (02/03 1300) Resp:  [15-20] 15 (02/03 1300) BP: (77-132)/(44-93) 121/73 mmHg (02/03 1300) SpO2:  [91 %-96 %] 93 % (02/03 1300) FiO2 (%):  [40 %] 40 % (02/03 1300)  HEMODYNAMICS: CVP:  [2 mmHg-11 mmHg] 2 mmHg  VENTILATOR SETTINGS: Vent Mode:  [-] PRVC FiO2 (%):  [40 %] 40 % Set Rate:  [15 bmp] 15 bmp Vt Set:  [520 mL] 520 mL PEEP:  [5 cmH20] 5 cmH20 Plateau Pressure:  [18 cmH20-20 cmH20] 19 cmH20  INTAKE / OUTPUT: Intake/Output     02/02 0701 - 02/03 0700 02/03 0701 - 02/04 0700   I.V. (mL/kg) 602.4 (6.6) 481.3 (5.3)   NG/GT 60 240   IV Piggyback 650 300   Total  Intake(mL/kg) 1312.4 (14.4) 1021.3 (11.2)   Urine (mL/kg/hr) 5040 (2.3) 475 (0.7)   Total Output 5040 475   Net -3727.6 +546.3          PHYSICAL EXAMINATION: General: Comatose Neuro:  Minimal withdrawal from pain, pupils fixed @ 5-6 mm, no spontaneous movement HEENT: NCAT Neck:  Supple Cardiovascular:  RRR s M Lungs:  Coarse breath sounds bilaterally Abdomen:  +BS, soft, NT, ND Musculoskeletal:  No clubbing or cyanosis Skin:  No rash  LABS: BMET    Component Value Date/Time   NA 175* 12/14/2013 0745   K 2.4* 2013-12-14 0745   CL >130* 12/14/2013 0745   CO2 30 Dec 14, 2013 0745   GLUCOSE 147* Dec 14, 2013 0745   BUN 9 12-14-2013 0745   CREATININE 1.17 2013-12-14 0745   CALCIUM 9.3 12-14-2013 0745   GFRNONAA 72* 2013-12-14 0745   GFRAA 84* 2013-12-14 0745   CBC    Component Value Date/Time   WBC 9.7 12-14-13 0400   RBC 5.39 2013-12-14 0400   HGB 16.7 2013-12-14 0400   HCT 49.2 2013/12/14 0400   PLT 92* 14-Dec-2013 0400   MCV 91.3 2013-12-14 0400   MCH 31.0 12/14/2013 0400   MCHC 33.9 12-14-2013 0400   RDW 14.5 2013/12/14 0400   LYMPHSABS 3.4 11/26/2013 1909   MONOABS 0.4 11/20/2013 1909   EOSABS 0.0 12/01/2013 1909   BASOSABS 0.0 11/18/2013 1909    CXR: R>L basilar infiltrates  ASSESSMENT: Principal Problem:   Cardiac arrest Active Problems:   Acute respiratory failure   Anoxic encephalopathy   Polysubstance overdose   Cerebral edema due to anoxia   Brain stem herniation   PNA (pneumonia)   Hypernatremia   Thrombocytopenia   Diabetes insipidus   Hyperglycemia  No realistic hope for meaningful survival or recovery  PLAN: Full DNR No further titration of vasopressors D/C all other therapies except vent Awaiting arrival of other family members/friends   Family updated in detail.   45 mins CCM time   Merton Border, MD ; [facility] [phone].  After 5:30 PM or weekends, call [phone]

## 2014-01-03 DEATH — deceased

## 2014-06-12 IMAGING — CR DG CHEST 1V PORT
1 series · 1 of 1 positions shown · non-contrast
Comparison: 12/07/2013

CLINICAL DATA: Check endotracheal tube position

EXAM:
PORTABLE CHEST - 1 VIEW

[AP]
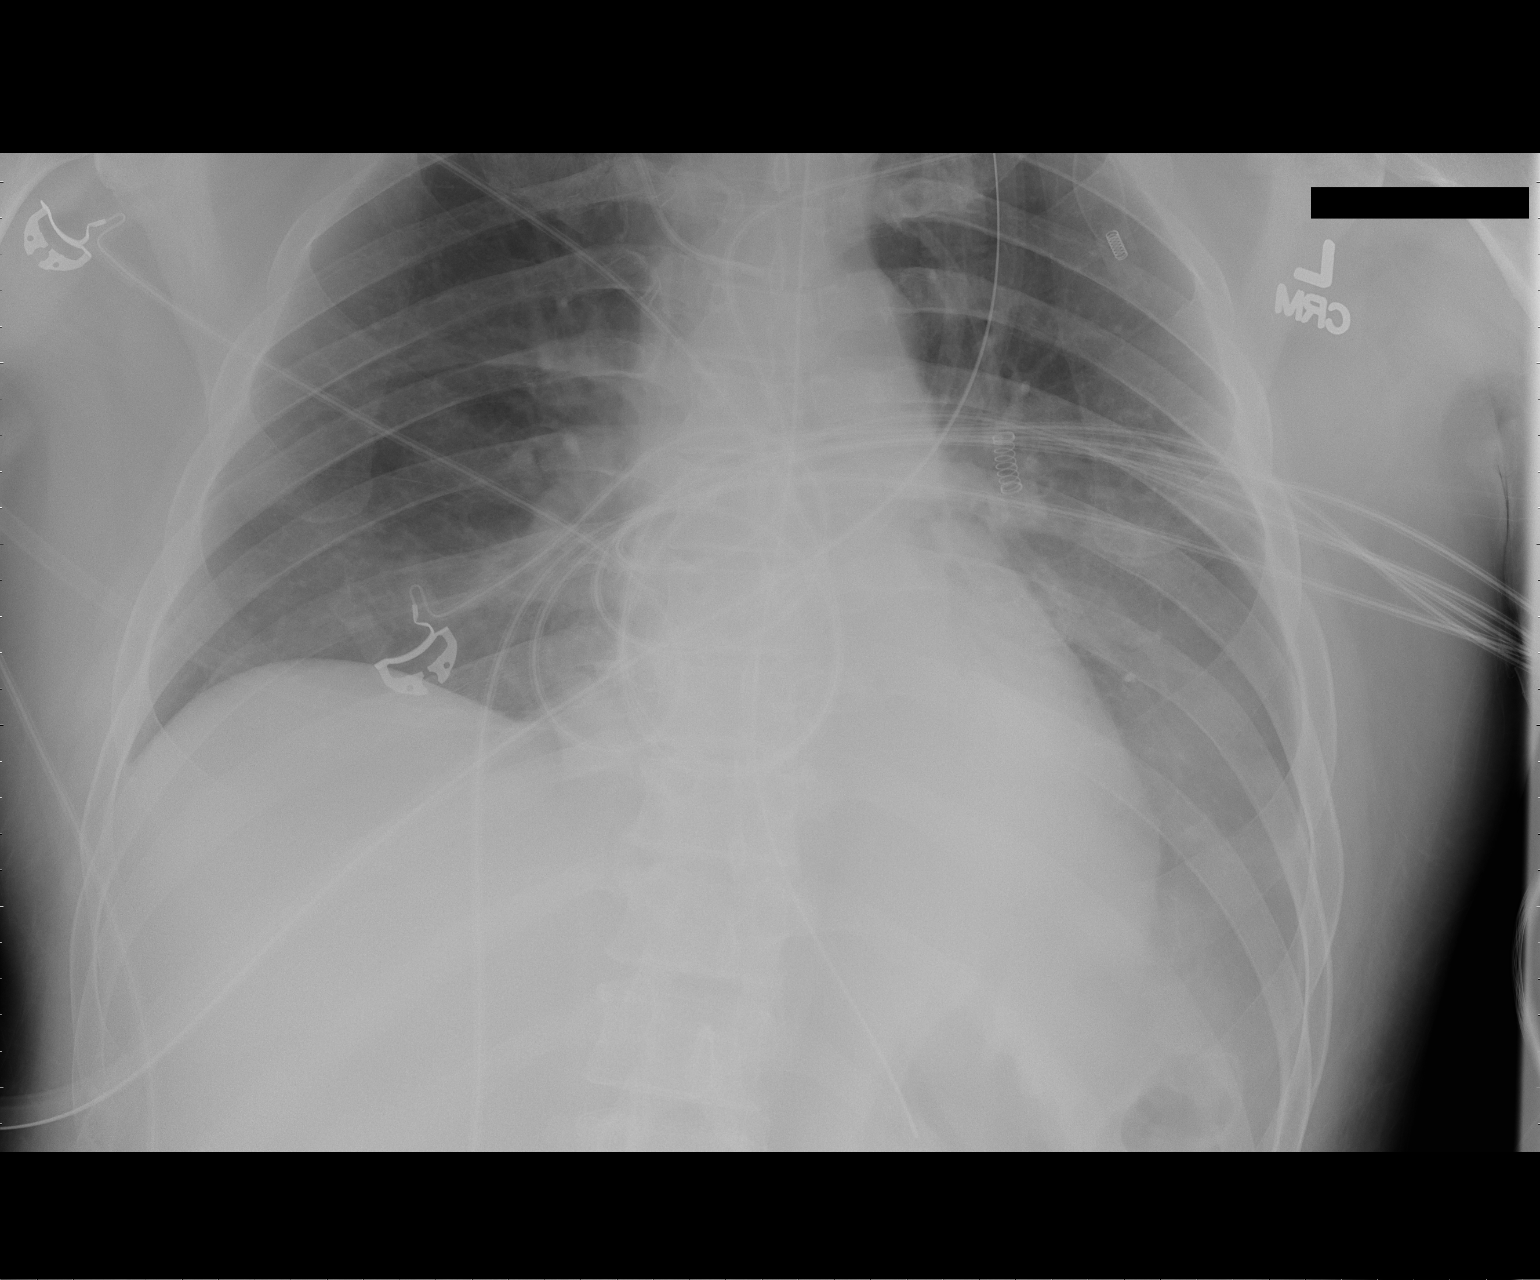

[1 of 1 positions shown; findings below may reference images not displayed]

FINDINGS: Endotracheal tube is identified with tip 4.2 cm above the carina.
Right subclavian central line is identified with tip over the
midline, which would suggest a position within the left
brachiocephalic vein. There is no pneumothorax. NG tube crosses into
the stomach.

Minimal hazy density right lung base with improved aeration on the
right. On the left side there is more extensive hazy opacity
involving the inferior of the long.
IMPRESSION: 1. Endotracheal tube as described
2. See above description of right subclavian central line
3. Improved aeration on the right with mild residual atelectasis
4. More extensive hazy opacity over the left mid to lower lung zone,
mildly worse when compared to the prior study. A combination of
pleural effusion and airspace opacity is suggested by this
appearance.
